# Patient Record
Sex: Male | Born: 1970 | Race: White | Hispanic: No | Marital: Married | Smoking: Current every day smoker
Health system: Southern US, Community
[De-identification: ages and names within clinical notes are randomized; demographics above are authoritative.]

## PROBLEM LIST (undated history)

## (undated) HISTORY — PX: INNER EAR SURGERY: SHX679

## (undated) HISTORY — PX: SHOULDER SURGERY: SHX246

## (undated) HISTORY — PX: CLAVICLE SURGERY: SHX598

---

## 2003-07-06 ENCOUNTER — Other Ambulatory Visit: Payer: Self-pay

## 2003-08-11 ENCOUNTER — Other Ambulatory Visit: Payer: Self-pay

## 2004-06-04 ENCOUNTER — Ambulatory Visit: Payer: Self-pay | Admitting: Physician Assistant

## 2004-06-12 ENCOUNTER — Ambulatory Visit: Payer: Self-pay | Admitting: Pain Medicine

## 2004-06-16 ENCOUNTER — Ambulatory Visit: Payer: Self-pay | Admitting: Pain Medicine

## 2004-07-02 ENCOUNTER — Ambulatory Visit: Payer: Self-pay | Admitting: Physician Assistant

## 2004-07-23 ENCOUNTER — Ambulatory Visit: Payer: Self-pay | Admitting: Pain Medicine

## 2004-08-31 ENCOUNTER — Ambulatory Visit: Payer: Self-pay | Admitting: Physician Assistant

## 2004-09-03 ENCOUNTER — Ambulatory Visit: Payer: Self-pay | Admitting: Pain Medicine

## 2004-09-07 ENCOUNTER — Ambulatory Visit: Payer: Self-pay | Admitting: Pain Medicine

## 2004-09-17 ENCOUNTER — Ambulatory Visit: Payer: Self-pay | Admitting: Physician Assistant

## 2004-11-17 ENCOUNTER — Ambulatory Visit: Payer: Self-pay | Admitting: Anesthesiology

## 2004-12-03 ENCOUNTER — Ambulatory Visit: Payer: Self-pay | Admitting: Anesthesiology

## 2004-12-15 ENCOUNTER — Emergency Department: Payer: Self-pay | Admitting: General Practice

## 2005-01-07 ENCOUNTER — Ambulatory Visit: Payer: Self-pay | Admitting: Internal Medicine

## 2005-02-03 ENCOUNTER — Ambulatory Visit: Payer: Self-pay | Admitting: Anesthesiology

## 2005-03-09 ENCOUNTER — Ambulatory Visit: Payer: Self-pay | Admitting: Anesthesiology

## 2005-03-15 ENCOUNTER — Ambulatory Visit: Payer: Self-pay | Admitting: Internal Medicine

## 2005-03-19 ENCOUNTER — Encounter: Admission: RE | Admit: 2005-03-19 | Discharge: 2005-03-19 | Payer: Self-pay | Admitting: Specialist

## 2005-05-21 ENCOUNTER — Ambulatory Visit: Payer: Self-pay | Admitting: Unknown Physician Specialty

## 2005-05-28 ENCOUNTER — Inpatient Hospital Stay (HOSPITAL_COMMUNITY): Admission: RE | Admit: 2005-05-28 | Discharge: 2005-05-31 | Payer: Self-pay | Admitting: Orthopedic Surgery

## 2005-08-13 ENCOUNTER — Ambulatory Visit: Payer: Self-pay | Admitting: Anesthesiology

## 2005-09-22 ENCOUNTER — Ambulatory Visit: Payer: Self-pay | Admitting: Anesthesiology

## 2005-10-28 ENCOUNTER — Ambulatory Visit: Payer: Self-pay | Admitting: Anesthesiology

## 2005-11-22 ENCOUNTER — Ambulatory Visit: Payer: Self-pay | Admitting: Anesthesiology

## 2006-01-10 ENCOUNTER — Ambulatory Visit: Payer: Self-pay | Admitting: Anesthesiology

## 2006-01-17 ENCOUNTER — Inpatient Hospital Stay: Payer: Self-pay | Admitting: Unknown Physician Specialty

## 2006-01-28 ENCOUNTER — Ambulatory Visit: Payer: Self-pay | Admitting: Internal Medicine

## 2006-02-07 ENCOUNTER — Ambulatory Visit: Payer: Self-pay | Admitting: Anesthesiology

## 2006-03-17 ENCOUNTER — Ambulatory Visit: Payer: Self-pay | Admitting: Anesthesiology

## 2006-04-18 ENCOUNTER — Ambulatory Visit: Payer: Self-pay | Admitting: Anesthesiology

## 2006-05-18 ENCOUNTER — Ambulatory Visit: Payer: Self-pay | Admitting: Anesthesiology

## 2006-06-16 ENCOUNTER — Ambulatory Visit: Payer: Self-pay | Admitting: Anesthesiology

## 2006-07-21 ENCOUNTER — Ambulatory Visit: Payer: Self-pay | Admitting: Anesthesiology

## 2006-09-06 ENCOUNTER — Ambulatory Visit: Payer: Self-pay | Admitting: Anesthesiology

## 2006-10-06 ENCOUNTER — Ambulatory Visit: Payer: Self-pay | Admitting: Anesthesiology

## 2006-10-31 ENCOUNTER — Ambulatory Visit: Payer: Self-pay | Admitting: Anesthesiology

## 2006-12-08 ENCOUNTER — Ambulatory Visit: Payer: Self-pay | Admitting: Anesthesiology

## 2007-01-03 ENCOUNTER — Ambulatory Visit: Payer: Self-pay | Admitting: Anesthesiology

## 2007-01-25 ENCOUNTER — Emergency Department: Payer: Self-pay | Admitting: Unknown Physician Specialty

## 2007-04-26 ENCOUNTER — Ambulatory Visit: Payer: Self-pay | Admitting: Anesthesiology

## 2007-06-09 ENCOUNTER — Ambulatory Visit: Payer: Self-pay | Admitting: Anesthesiology

## 2007-07-06 ENCOUNTER — Ambulatory Visit: Payer: Self-pay | Admitting: Anesthesiology

## 2007-07-15 ENCOUNTER — Ambulatory Visit: Payer: Self-pay | Admitting: Anesthesiology

## 2007-08-07 ENCOUNTER — Ambulatory Visit: Payer: Self-pay | Admitting: Anesthesiology

## 2007-09-14 ENCOUNTER — Ambulatory Visit: Payer: Self-pay | Admitting: Anesthesiology

## 2007-10-18 ENCOUNTER — Ambulatory Visit: Payer: Self-pay | Admitting: Anesthesiology

## 2007-12-13 ENCOUNTER — Ambulatory Visit: Payer: Self-pay | Admitting: Anesthesiology

## 2007-12-27 ENCOUNTER — Ambulatory Visit: Payer: Self-pay | Admitting: Pain Medicine

## 2008-01-02 ENCOUNTER — Ambulatory Visit: Payer: Self-pay | Admitting: Pain Medicine

## 2008-01-03 ENCOUNTER — Ambulatory Visit: Payer: Self-pay | Admitting: Pain Medicine

## 2008-01-11 ENCOUNTER — Ambulatory Visit: Payer: Self-pay | Admitting: Anesthesiology

## 2008-01-31 ENCOUNTER — Ambulatory Visit: Payer: Self-pay | Admitting: Anesthesiology

## 2008-02-27 ENCOUNTER — Ambulatory Visit: Payer: Self-pay | Admitting: Anesthesiology

## 2008-03-25 ENCOUNTER — Ambulatory Visit: Payer: Self-pay | Admitting: Anesthesiology

## 2008-04-25 ENCOUNTER — Ambulatory Visit: Payer: Self-pay | Admitting: Anesthesiology

## 2008-06-10 ENCOUNTER — Ambulatory Visit: Payer: Self-pay | Admitting: Anesthesiology

## 2008-06-14 ENCOUNTER — Emergency Department: Payer: Self-pay | Admitting: Emergency Medicine

## 2008-07-11 ENCOUNTER — Ambulatory Visit: Payer: Self-pay | Admitting: Anesthesiology

## 2008-07-24 ENCOUNTER — Ambulatory Visit: Payer: Self-pay | Admitting: Anesthesiology

## 2008-08-13 ENCOUNTER — Ambulatory Visit: Payer: Self-pay | Admitting: Anesthesiology

## 2008-09-05 ENCOUNTER — Ambulatory Visit: Payer: Self-pay | Admitting: Anesthesiology

## 2009-01-09 ENCOUNTER — Ambulatory Visit: Payer: Self-pay | Admitting: Anesthesiology

## 2009-02-06 ENCOUNTER — Ambulatory Visit: Payer: Self-pay | Admitting: Anesthesiology

## 2009-02-26 ENCOUNTER — Ambulatory Visit: Payer: Self-pay | Admitting: Anesthesiology

## 2009-10-24 ENCOUNTER — Emergency Department: Payer: Self-pay | Admitting: Emergency Medicine

## 2010-06-28 ENCOUNTER — Emergency Department (HOSPITAL_COMMUNITY)
Admission: EM | Admit: 2010-06-28 | Discharge: 2010-06-29 | Payer: Self-pay | Source: Home / Self Care | Admitting: Emergency Medicine

## 2010-06-28 ENCOUNTER — Emergency Department (HOSPITAL_COMMUNITY)
Admission: EM | Admit: 2010-06-28 | Discharge: 2010-06-28 | Payer: Self-pay | Source: Home / Self Care | Admitting: Emergency Medicine

## 2010-11-15 DIAGNOSIS — G894 Chronic pain syndrome: Secondary | ICD-10-CM

## 2010-11-15 DIAGNOSIS — M25529 Pain in unspecified elbow: Secondary | ICD-10-CM

## 2010-11-15 DIAGNOSIS — F17200 Nicotine dependence, unspecified, uncomplicated: Secondary | ICD-10-CM | POA: Insufficient documentation

## 2010-11-15 DIAGNOSIS — M542 Cervicalgia: Secondary | ICD-10-CM

## 2010-11-15 HISTORY — DX: Pain in unspecified elbow: M25.529

## 2010-11-15 HISTORY — DX: Cervicalgia: M54.2

## 2010-11-15 HISTORY — DX: Chronic pain syndrome: G89.4
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
## 2013-02-05 DIAGNOSIS — Z79891 Long term (current) use of opiate analgesic: Secondary | ICD-10-CM

## 2013-02-05 DIAGNOSIS — M533 Sacrococcygeal disorders, not elsewhere classified: Secondary | ICD-10-CM

## 2013-02-05 DIAGNOSIS — Z0289 Encounter for other administrative examinations: Secondary | ICD-10-CM | POA: Insufficient documentation

## 2013-02-05 HISTORY — DX: Long term (current) use of opiate analgesic: Z79.891

## 2013-02-05 HISTORY — DX: Sacrococcygeal disorders, not elsewhere classified: M53.3

## 2013-11-06 DIAGNOSIS — F1190 Opioid use, unspecified, uncomplicated: Secondary | ICD-10-CM | POA: Insufficient documentation

## 2013-11-06 DIAGNOSIS — IMO0002 Reserved for concepts with insufficient information to code with codable children: Secondary | ICD-10-CM

## 2013-11-06 HISTORY — DX: Reserved for concepts with insufficient information to code with codable children: IMO0002

## 2013-11-06 HISTORY — DX: Opioid use, unspecified, uncomplicated: F11.90

## 2014-09-16 DIAGNOSIS — G8929 Other chronic pain: Secondary | ICD-10-CM | POA: Diagnosis not present

## 2014-09-16 DIAGNOSIS — M533 Sacrococcygeal disorders, not elsewhere classified: Secondary | ICD-10-CM | POA: Diagnosis not present

## 2014-09-16 DIAGNOSIS — M7702 Medial epicondylitis, left elbow: Secondary | ICD-10-CM | POA: Diagnosis not present

## 2014-09-16 DIAGNOSIS — M7701 Medial epicondylitis, right elbow: Secondary | ICD-10-CM | POA: Diagnosis not present

## 2014-09-16 DIAGNOSIS — M47816 Spondylosis without myelopathy or radiculopathy, lumbar region: Secondary | ICD-10-CM | POA: Diagnosis not present

## 2014-09-16 DIAGNOSIS — Z791 Long term (current) use of non-steroidal anti-inflammatories (NSAID): Secondary | ICD-10-CM | POA: Diagnosis not present

## 2014-09-16 DIAGNOSIS — Z79891 Long term (current) use of opiate analgesic: Secondary | ICD-10-CM | POA: Diagnosis not present

## 2014-09-16 DIAGNOSIS — F17210 Nicotine dependence, cigarettes, uncomplicated: Secondary | ICD-10-CM | POA: Diagnosis not present

## 2014-09-16 DIAGNOSIS — M542 Cervicalgia: Secondary | ICD-10-CM | POA: Diagnosis not present

## 2014-09-16 DIAGNOSIS — M25529 Pain in unspecified elbow: Secondary | ICD-10-CM | POA: Diagnosis not present

## 2014-11-22 ENCOUNTER — Ambulatory Visit: Payer: Self-pay

## 2014-12-09 DIAGNOSIS — M79629 Pain in unspecified upper arm: Secondary | ICD-10-CM | POA: Diagnosis not present

## 2014-12-09 DIAGNOSIS — M542 Cervicalgia: Secondary | ICD-10-CM | POA: Diagnosis not present

## 2014-12-09 DIAGNOSIS — G8929 Other chronic pain: Secondary | ICD-10-CM | POA: Diagnosis not present

## 2014-12-09 DIAGNOSIS — M1990 Unspecified osteoarthritis, unspecified site: Secondary | ICD-10-CM | POA: Diagnosis not present

## 2014-12-09 DIAGNOSIS — M47816 Spondylosis without myelopathy or radiculopathy, lumbar region: Secondary | ICD-10-CM | POA: Diagnosis not present

## 2014-12-09 DIAGNOSIS — M25529 Pain in unspecified elbow: Secondary | ICD-10-CM | POA: Diagnosis not present

## 2014-12-09 DIAGNOSIS — F1190 Opioid use, unspecified, uncomplicated: Secondary | ICD-10-CM | POA: Diagnosis not present

## 2014-12-09 DIAGNOSIS — M533 Sacrococcygeal disorders, not elsewhere classified: Secondary | ICD-10-CM | POA: Diagnosis not present

## 2014-12-09 DIAGNOSIS — R51 Headache: Secondary | ICD-10-CM | POA: Diagnosis not present

## 2014-12-09 DIAGNOSIS — F17210 Nicotine dependence, cigarettes, uncomplicated: Secondary | ICD-10-CM | POA: Diagnosis not present

## 2015-03-11 DIAGNOSIS — R079 Chest pain, unspecified: Secondary | ICD-10-CM | POA: Diagnosis not present

## 2015-03-24 DIAGNOSIS — M25529 Pain in unspecified elbow: Secondary | ICD-10-CM | POA: Diagnosis not present

## 2015-03-24 DIAGNOSIS — F1190 Opioid use, unspecified, uncomplicated: Secondary | ICD-10-CM | POA: Diagnosis not present

## 2015-03-24 DIAGNOSIS — M542 Cervicalgia: Secondary | ICD-10-CM | POA: Diagnosis not present

## 2015-03-24 DIAGNOSIS — M47816 Spondylosis without myelopathy or radiculopathy, lumbar region: Secondary | ICD-10-CM | POA: Diagnosis not present

## 2015-03-24 DIAGNOSIS — G8929 Other chronic pain: Secondary | ICD-10-CM | POA: Diagnosis not present

## 2015-03-24 DIAGNOSIS — M533 Sacrococcygeal disorders, not elsewhere classified: Secondary | ICD-10-CM | POA: Diagnosis not present

## 2015-03-24 DIAGNOSIS — R51 Headache: Secondary | ICD-10-CM | POA: Diagnosis not present

## 2015-06-19 DIAGNOSIS — M25572 Pain in left ankle and joints of left foot: Secondary | ICD-10-CM | POA: Diagnosis not present

## 2015-06-19 DIAGNOSIS — M25522 Pain in left elbow: Secondary | ICD-10-CM | POA: Diagnosis not present

## 2015-06-19 DIAGNOSIS — M7701 Medial epicondylitis, right elbow: Secondary | ICD-10-CM | POA: Diagnosis not present

## 2015-06-19 DIAGNOSIS — M7702 Medial epicondylitis, left elbow: Secondary | ICD-10-CM | POA: Diagnosis not present

## 2015-06-19 DIAGNOSIS — M533 Sacrococcygeal disorders, not elsewhere classified: Secondary | ICD-10-CM | POA: Diagnosis not present

## 2015-06-19 DIAGNOSIS — M542 Cervicalgia: Secondary | ICD-10-CM | POA: Diagnosis not present

## 2015-06-19 DIAGNOSIS — M25521 Pain in right elbow: Secondary | ICD-10-CM | POA: Diagnosis not present

## 2015-06-19 DIAGNOSIS — G894 Chronic pain syndrome: Secondary | ICD-10-CM | POA: Diagnosis not present

## 2015-06-19 DIAGNOSIS — M47816 Spondylosis without myelopathy or radiculopathy, lumbar region: Secondary | ICD-10-CM | POA: Diagnosis not present

## 2015-07-03 DIAGNOSIS — M25572 Pain in left ankle and joints of left foot: Secondary | ICD-10-CM | POA: Diagnosis not present

## 2015-08-25 ENCOUNTER — Encounter: Payer: Self-pay | Admitting: Emergency Medicine

## 2015-08-25 ENCOUNTER — Emergency Department
Admission: EM | Admit: 2015-08-25 | Discharge: 2015-08-25 | Disposition: A | Discharge status: Home / Self Care | Payer: Commercial Managed Care - HMO | Attending: Emergency Medicine | Admitting: Emergency Medicine

## 2015-08-25 ENCOUNTER — Emergency Department: Payer: Commercial Managed Care - HMO

## 2015-08-25 DIAGNOSIS — R51 Headache: Secondary | ICD-10-CM | POA: Diagnosis not present

## 2015-08-25 DIAGNOSIS — L910 Hypertrophic scar: Secondary | ICD-10-CM | POA: Diagnosis not present

## 2015-08-25 DIAGNOSIS — F17210 Nicotine dependence, cigarettes, uncomplicated: Secondary | ICD-10-CM | POA: Insufficient documentation

## 2015-08-25 DIAGNOSIS — H938X1 Other specified disorders of right ear: Secondary | ICD-10-CM | POA: Diagnosis present

## 2015-08-25 DIAGNOSIS — R42 Dizziness and giddiness: Secondary | ICD-10-CM | POA: Diagnosis not present

## 2015-08-25 MED ORDER — BUTALBITAL-APAP-CAFFEINE 50-325-40 MG PO TABS: 1.0000 | ORAL_TABLET | Freq: Four times a day (QID) | ORAL | Status: DC | PRN

## 2015-08-25 MED ORDER — TRAMADOL HCL 50 MG PO TABS: 50.0000 mg | ORAL_TABLET | Freq: Four times a day (QID) | ORAL | Status: AC | PRN

## 2015-08-25 NOTE — ED Notes (Signed)
AAOx3.  Skin warm and dry.  NAD 
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
## 2015-08-25 NOTE — Discharge Instructions (Signed)
Advised to follow-up with dermatology for definitive vibration and treatment.
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
## 2015-09-10 DIAGNOSIS — F1190 Opioid use, unspecified, uncomplicated: Secondary | ICD-10-CM | POA: Diagnosis not present

## 2015-09-10 DIAGNOSIS — Z79891 Long term (current) use of opiate analgesic: Secondary | ICD-10-CM | POA: Diagnosis not present

## 2015-09-10 DIAGNOSIS — R51 Headache: Secondary | ICD-10-CM | POA: Diagnosis not present

## 2015-09-10 DIAGNOSIS — M47816 Spondylosis without myelopathy or radiculopathy, lumbar region: Secondary | ICD-10-CM | POA: Diagnosis not present

## 2015-09-10 DIAGNOSIS — M25529 Pain in unspecified elbow: Secondary | ICD-10-CM | POA: Diagnosis not present

## 2015-09-10 DIAGNOSIS — Z791 Long term (current) use of non-steroidal anti-inflammatories (NSAID): Secondary | ICD-10-CM | POA: Diagnosis not present

## 2015-09-10 DIAGNOSIS — M542 Cervicalgia: Secondary | ICD-10-CM | POA: Diagnosis not present

## 2015-09-10 DIAGNOSIS — M7702 Medial epicondylitis, left elbow: Secondary | ICD-10-CM | POA: Diagnosis not present

## 2015-09-10 DIAGNOSIS — M533 Sacrococcygeal disorders, not elsewhere classified: Secondary | ICD-10-CM | POA: Diagnosis not present

## 2015-09-10 DIAGNOSIS — M7701 Medial epicondylitis, right elbow: Secondary | ICD-10-CM | POA: Diagnosis not present

## 2015-09-10 DIAGNOSIS — G8929 Other chronic pain: Secondary | ICD-10-CM | POA: Diagnosis not present

## 2015-12-04 ENCOUNTER — Telehealth: Payer: Self-pay | Admitting: Family Medicine

## 2015-12-04 DIAGNOSIS — G894 Chronic pain syndrome: Secondary | ICD-10-CM | POA: Diagnosis not present

## 2015-12-04 DIAGNOSIS — M25529 Pain in unspecified elbow: Secondary | ICD-10-CM | POA: Diagnosis not present

## 2015-12-04 DIAGNOSIS — G8929 Other chronic pain: Secondary | ICD-10-CM | POA: Diagnosis not present

## 2015-12-04 DIAGNOSIS — M533 Sacrococcygeal disorders, not elsewhere classified: Secondary | ICD-10-CM | POA: Diagnosis not present

## 2015-12-04 DIAGNOSIS — Z79891 Long term (current) use of opiate analgesic: Secondary | ICD-10-CM | POA: Diagnosis not present

## 2015-12-04 DIAGNOSIS — M47816 Spondylosis without myelopathy or radiculopathy, lumbar region: Secondary | ICD-10-CM | POA: Diagnosis not present

## 2015-12-04 DIAGNOSIS — M542 Cervicalgia: Secondary | ICD-10-CM | POA: Diagnosis not present

## 2015-12-04 DIAGNOSIS — M25572 Pain in left ankle and joints of left foot: Secondary | ICD-10-CM | POA: Diagnosis not present

## 2015-12-04 NOTE — Telephone Encounter (Signed)
Ok to refer. Thanks

## 2015-12-04 NOTE — Telephone Encounter (Signed)
Patient is a patient of Dr. Sabino Snipes, Jamas Lav from [facility] has called requesting a referral for patient for pain management specialist at [facility]. She states that patient has been following up with Dr. Despina Arias ( her office number is [phone]) and is requesting referral from his primary. Jamas Lav states that patient is in office now and needs approval for referral. Please advise. KW

## 2015-12-04 NOTE — Telephone Encounter (Signed)
Jamas Lav called office back, she can be reached at [phone].KW

## 2015-12-04 NOTE — Telephone Encounter (Signed)
Left message with renee to call back.  Contact number is: [phone] (For [facility] pain management.)  Thanks,   -Mickel Baas

## 2015-12-04 NOTE — Telephone Encounter (Signed)
Order put in please review-aa

## 2015-12-05 NOTE — Telephone Encounter (Signed)
Renee with [facility] called stating they do not need a referral. Pt was seen yesterday and is a pt in their office.  CW:[id number]

## 2015-12-19 DIAGNOSIS — M9511 Cauliflower ear, right ear: Secondary | ICD-10-CM | POA: Diagnosis not present

## 2016-03-02 DIAGNOSIS — G894 Chronic pain syndrome: Secondary | ICD-10-CM | POA: Diagnosis not present

## 2016-03-02 DIAGNOSIS — M25521 Pain in right elbow: Secondary | ICD-10-CM | POA: Diagnosis not present

## 2016-03-02 DIAGNOSIS — G8929 Other chronic pain: Secondary | ICD-10-CM | POA: Diagnosis not present

## 2016-03-02 DIAGNOSIS — M47816 Spondylosis without myelopathy or radiculopathy, lumbar region: Secondary | ICD-10-CM | POA: Diagnosis not present

## 2016-03-02 DIAGNOSIS — M25522 Pain in left elbow: Secondary | ICD-10-CM | POA: Diagnosis not present

## 2016-03-02 DIAGNOSIS — M25529 Pain in unspecified elbow: Secondary | ICD-10-CM | POA: Diagnosis not present

## 2016-03-02 DIAGNOSIS — Z0289 Encounter for other administrative examinations: Secondary | ICD-10-CM | POA: Diagnosis not present

## 2016-03-02 DIAGNOSIS — M25569 Pain in unspecified knee: Secondary | ICD-10-CM | POA: Diagnosis not present

## 2016-03-02 DIAGNOSIS — Z79891 Long term (current) use of opiate analgesic: Secondary | ICD-10-CM | POA: Diagnosis not present

## 2016-03-02 DIAGNOSIS — M7702 Medial epicondylitis, left elbow: Secondary | ICD-10-CM | POA: Diagnosis not present

## 2016-03-02 DIAGNOSIS — M7701 Medial epicondylitis, right elbow: Secondary | ICD-10-CM | POA: Diagnosis not present

## 2016-03-02 DIAGNOSIS — R51 Headache: Secondary | ICD-10-CM | POA: Diagnosis not present

## 2016-03-02 DIAGNOSIS — M533 Sacrococcygeal disorders, not elsewhere classified: Secondary | ICD-10-CM | POA: Diagnosis not present

## 2016-03-02 DIAGNOSIS — M542 Cervicalgia: Secondary | ICD-10-CM | POA: Diagnosis not present

## 2016-04-22 DIAGNOSIS — Z87828 Personal history of other (healed) physical injury and trauma: Secondary | ICD-10-CM | POA: Diagnosis not present

## 2016-04-22 DIAGNOSIS — Z6823 Body mass index (BMI) 23.0-23.9, adult: Secondary | ICD-10-CM | POA: Diagnosis not present

## 2016-04-22 DIAGNOSIS — Z1389 Encounter for screening for other disorder: Secondary | ICD-10-CM | POA: Diagnosis not present

## 2016-04-22 DIAGNOSIS — L905 Scar conditions and fibrosis of skin: Secondary | ICD-10-CM | POA: Diagnosis not present

## 2016-04-22 DIAGNOSIS — H9311 Tinnitus, right ear: Secondary | ICD-10-CM | POA: Diagnosis not present

## 2016-05-20 DIAGNOSIS — H9319 Tinnitus, unspecified ear: Secondary | ICD-10-CM | POA: Diagnosis not present

## 2016-05-20 DIAGNOSIS — M9511 Cauliflower ear, right ear: Secondary | ICD-10-CM | POA: Diagnosis not present

## 2016-05-20 DIAGNOSIS — H906 Mixed conductive and sensorineural hearing loss, bilateral: Secondary | ICD-10-CM | POA: Diagnosis not present

## 2016-05-31 DIAGNOSIS — R51 Headache: Secondary | ICD-10-CM | POA: Diagnosis not present

## 2016-05-31 DIAGNOSIS — M25529 Pain in unspecified elbow: Secondary | ICD-10-CM | POA: Diagnosis not present

## 2016-05-31 DIAGNOSIS — M47816 Spondylosis without myelopathy or radiculopathy, lumbar region: Secondary | ICD-10-CM | POA: Diagnosis not present

## 2016-05-31 DIAGNOSIS — Z79891 Long term (current) use of opiate analgesic: Secondary | ICD-10-CM | POA: Diagnosis not present

## 2016-05-31 DIAGNOSIS — G894 Chronic pain syndrome: Secondary | ICD-10-CM | POA: Diagnosis not present

## 2016-05-31 DIAGNOSIS — G8929 Other chronic pain: Secondary | ICD-10-CM | POA: Diagnosis not present

## 2016-05-31 DIAGNOSIS — M533 Sacrococcygeal disorders, not elsewhere classified: Secondary | ICD-10-CM | POA: Diagnosis not present

## 2016-05-31 DIAGNOSIS — M542 Cervicalgia: Secondary | ICD-10-CM | POA: Diagnosis not present

## 2016-05-31 IMAGING — CT CT HEAD W/O CM
1 series · 16 of 30 positions shown, 20 images · non-contrast
Comparison: 06/29/2010

CLINICAL DATA: Right-sided headache for 3 months with dizziness and
blurred vision.

EXAM:
CT HEAD WITHOUT CONTRAST
TECHNIQUE: Contiguous axial images were obtained from the base of the skull
through the vertex without intravenous contrast.

[Series 2: head wo · axial · 0.40mm/px · z∈[-15,+115]mm · 16 of 30 slices shown, 20 images]
[im 2/30  brain]
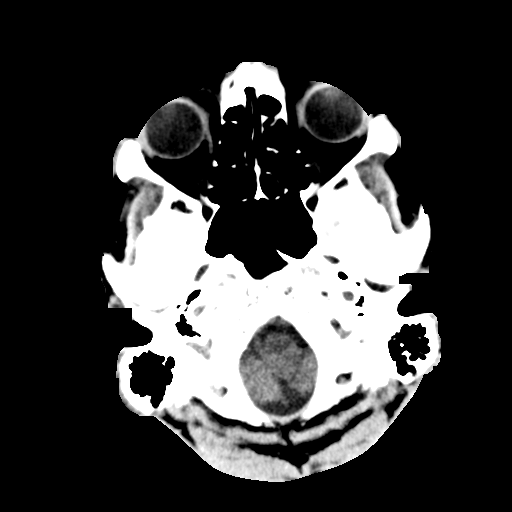
[im 2/30  bone]
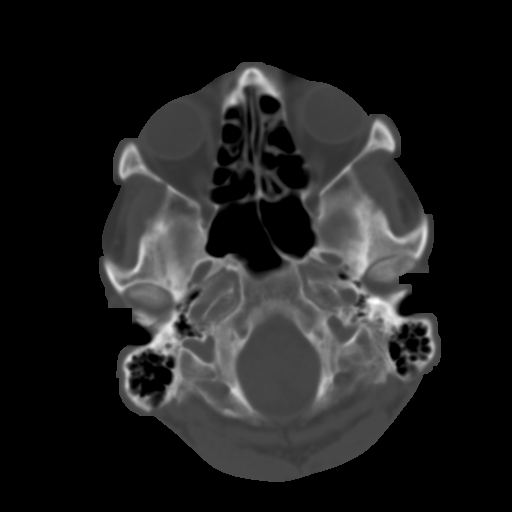
[im 4/30  brain]
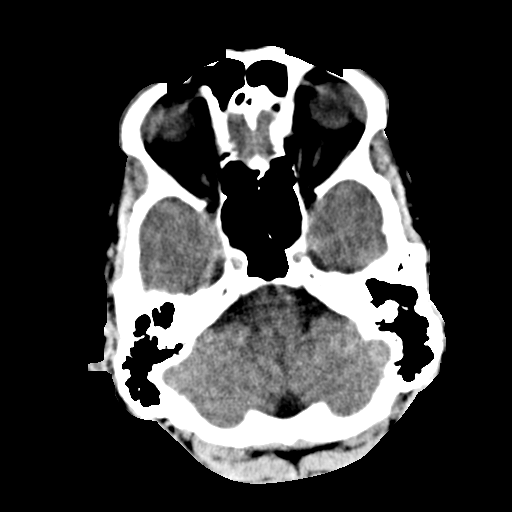
[im 6/30  brain]
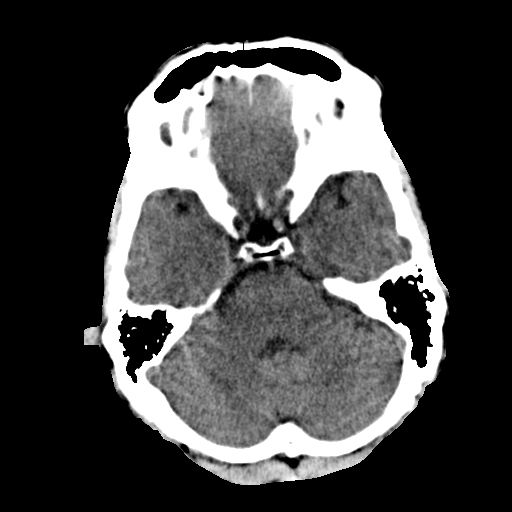
[im 8/30  brain]
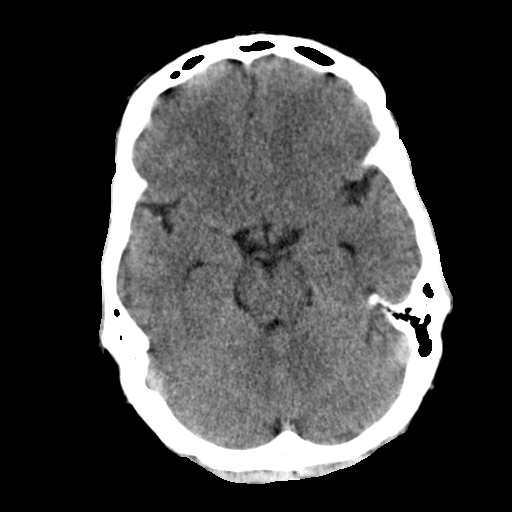
[im 9/30  brain]
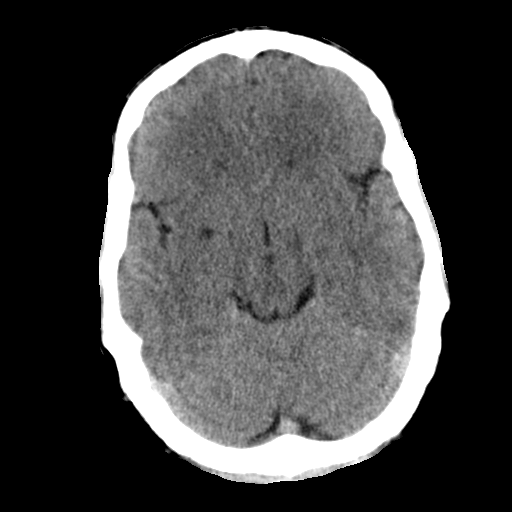
[im 9/30  bone]
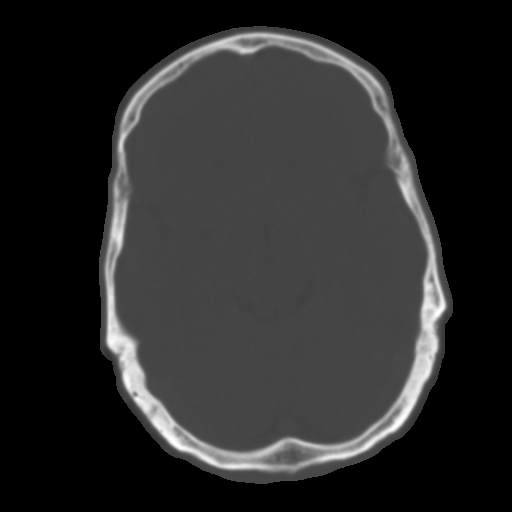
[im 11/30  brain]
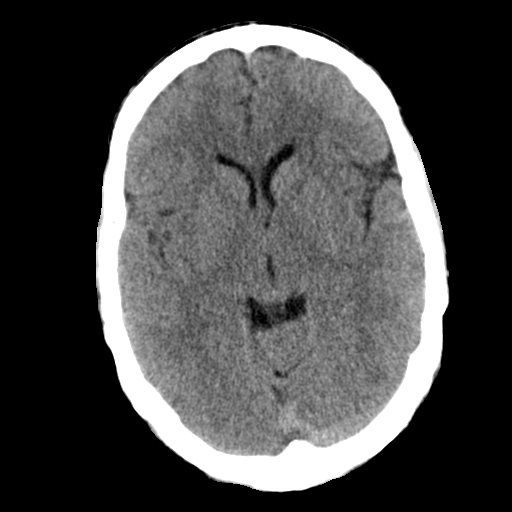
[im 13/30  brain]
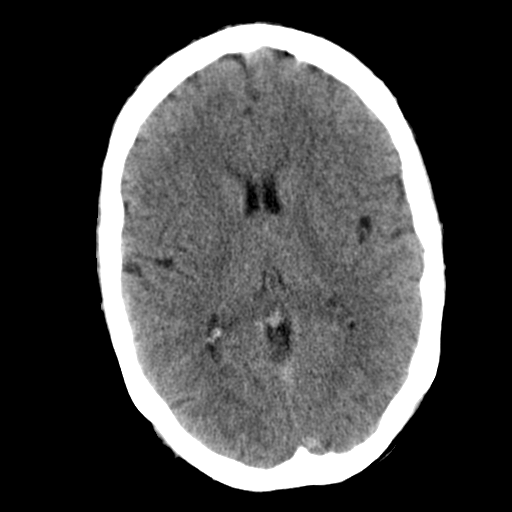
[im 15/30  brain]
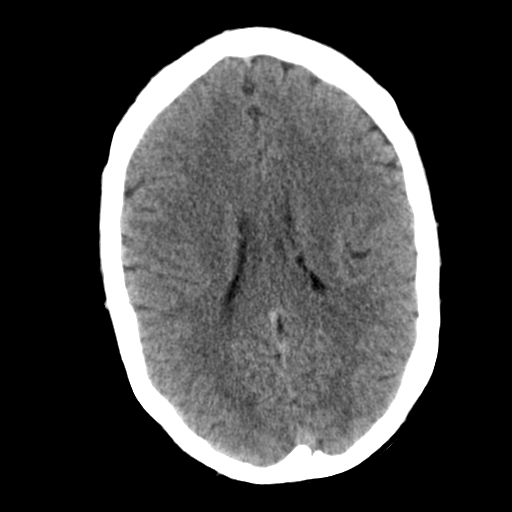
[im 16/30  brain]
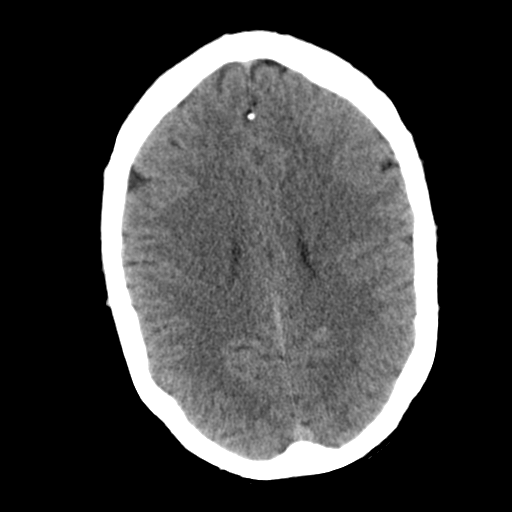
[im 16/30  bone]
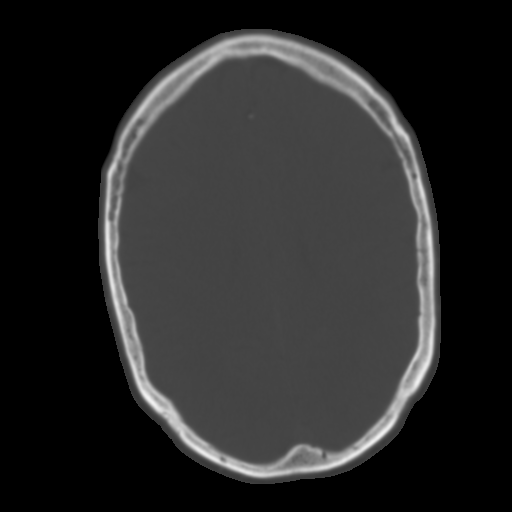
[im 18/30  brain]
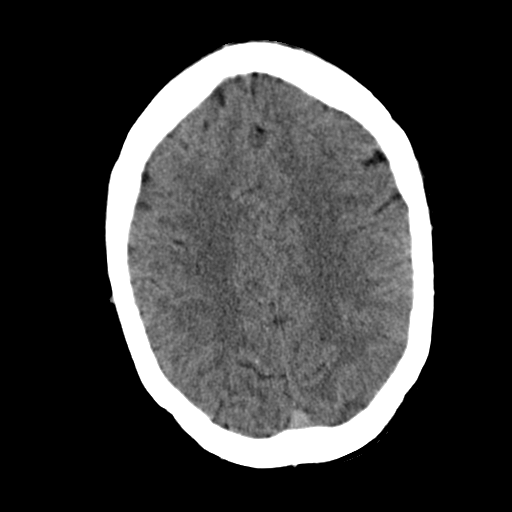
[im 20/30  brain]
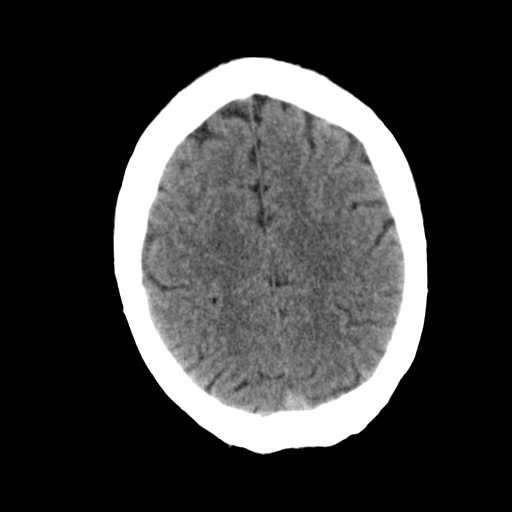
[im 22/30  brain]
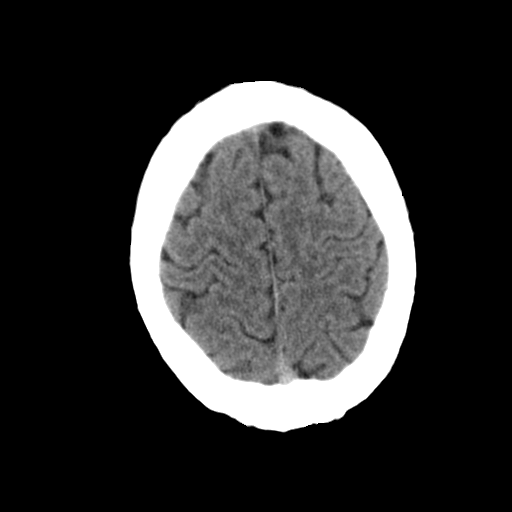
[im 23/30  brain]
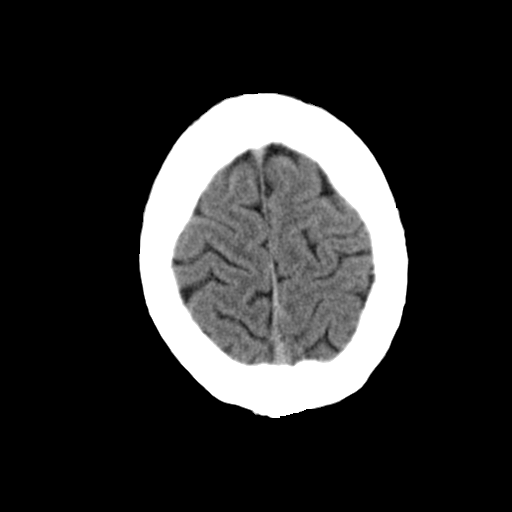
[im 23/30  bone]
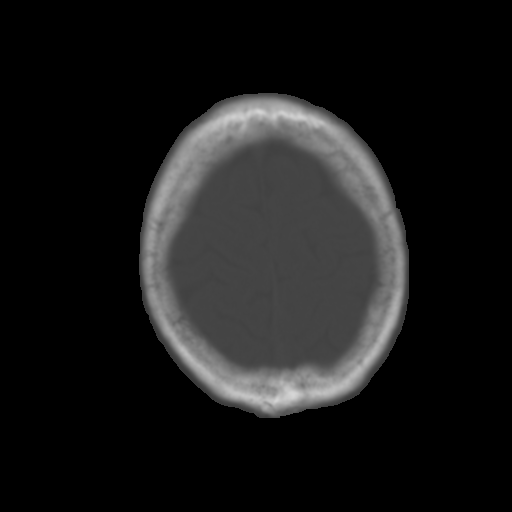
[im 25/30  brain]
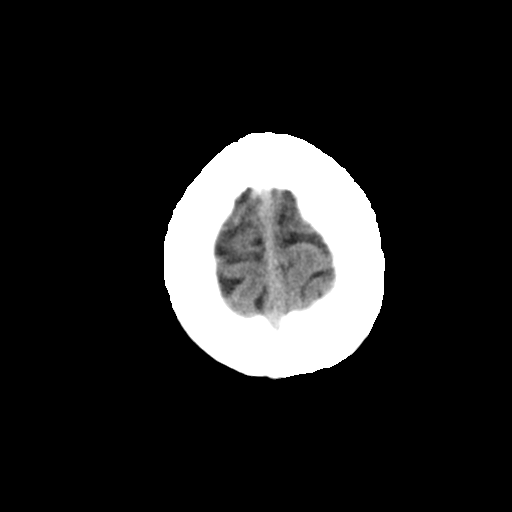
[im 27/30  brain]
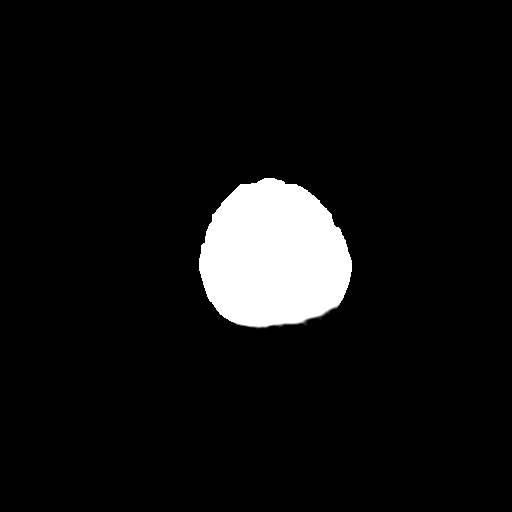
[im 29/30  brain]
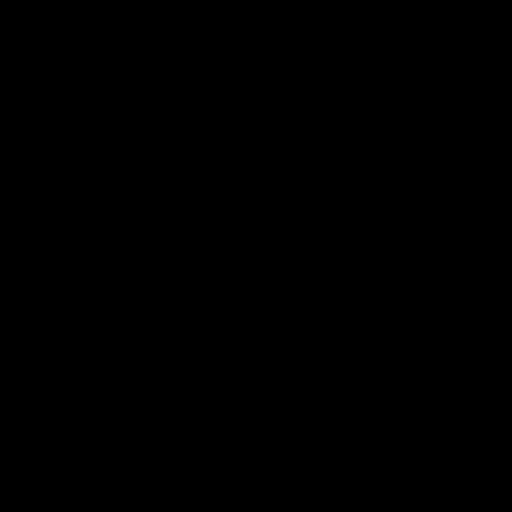

[16 of 30 positions shown; findings below may reference images not displayed]

FINDINGS: Sinuses/Soft tissues: Mild mucosal thickening of ethmoid air cells
and the inferior aspect the left frontal sinus. Clear mastoid air
cells.

Intracranial: No mass lesion, hemorrhage, hydrocephalus, acute
infarct, intra-axial, or extra-axial fluid collection.
IMPRESSION: No acute intracranial abnormality.

Minimal sinus disease.

## 2016-07-06 DIAGNOSIS — M9511 Cauliflower ear, right ear: Secondary | ICD-10-CM | POA: Diagnosis not present

## 2016-07-06 DIAGNOSIS — H61119 Acquired deformity of pinna, unspecified ear: Secondary | ICD-10-CM | POA: Diagnosis not present

## 2016-09-13 DIAGNOSIS — G894 Chronic pain syndrome: Secondary | ICD-10-CM | POA: Diagnosis not present

## 2016-09-13 DIAGNOSIS — M47816 Spondylosis without myelopathy or radiculopathy, lumbar region: Secondary | ICD-10-CM | POA: Diagnosis not present

## 2016-09-13 DIAGNOSIS — M542 Cervicalgia: Secondary | ICD-10-CM | POA: Diagnosis not present

## 2016-09-13 DIAGNOSIS — M533 Sacrococcygeal disorders, not elsewhere classified: Secondary | ICD-10-CM | POA: Diagnosis not present

## 2016-12-15 DIAGNOSIS — M47816 Spondylosis without myelopathy or radiculopathy, lumbar region: Secondary | ICD-10-CM | POA: Diagnosis not present

## 2016-12-15 DIAGNOSIS — M25572 Pain in left ankle and joints of left foot: Secondary | ICD-10-CM | POA: Diagnosis not present

## 2016-12-15 DIAGNOSIS — M7702 Medial epicondylitis, left elbow: Secondary | ICD-10-CM | POA: Diagnosis not present

## 2016-12-15 DIAGNOSIS — M542 Cervicalgia: Secondary | ICD-10-CM | POA: Diagnosis not present

## 2016-12-15 DIAGNOSIS — Z79891 Long term (current) use of opiate analgesic: Secondary | ICD-10-CM | POA: Diagnosis not present

## 2016-12-15 DIAGNOSIS — M533 Sacrococcygeal disorders, not elsewhere classified: Secondary | ICD-10-CM | POA: Diagnosis not present

## 2016-12-15 DIAGNOSIS — M7701 Medial epicondylitis, right elbow: Secondary | ICD-10-CM | POA: Diagnosis not present

## 2016-12-15 DIAGNOSIS — G894 Chronic pain syndrome: Secondary | ICD-10-CM | POA: Diagnosis not present

## 2016-12-15 DIAGNOSIS — M791 Myalgia: Secondary | ICD-10-CM | POA: Diagnosis not present

## 2017-03-17 DIAGNOSIS — M7702 Medial epicondylitis, left elbow: Secondary | ICD-10-CM | POA: Insufficient documentation

## 2017-03-17 DIAGNOSIS — G894 Chronic pain syndrome: Secondary | ICD-10-CM | POA: Diagnosis not present

## 2017-03-17 DIAGNOSIS — M7712 Lateral epicondylitis, left elbow: Secondary | ICD-10-CM | POA: Diagnosis not present

## 2017-03-17 DIAGNOSIS — M7711 Lateral epicondylitis, right elbow: Secondary | ICD-10-CM

## 2017-03-17 DIAGNOSIS — M7701 Medial epicondylitis, right elbow: Secondary | ICD-10-CM

## 2017-03-17 DIAGNOSIS — M791 Myalgia: Secondary | ICD-10-CM | POA: Diagnosis not present

## 2017-03-17 DIAGNOSIS — G8929 Other chronic pain: Secondary | ICD-10-CM | POA: Diagnosis not present

## 2017-03-17 DIAGNOSIS — M533 Sacrococcygeal disorders, not elsewhere classified: Secondary | ICD-10-CM | POA: Diagnosis not present

## 2017-03-17 DIAGNOSIS — M25529 Pain in unspecified elbow: Secondary | ICD-10-CM | POA: Diagnosis not present

## 2017-03-17 HISTORY — DX: Medial epicondylitis, right elbow: M77.01

## 2017-03-17 HISTORY — DX: Lateral epicondylitis, right elbow: M77.11

## 2017-06-14 DIAGNOSIS — M542 Cervicalgia: Secondary | ICD-10-CM | POA: Diagnosis not present

## 2017-06-14 DIAGNOSIS — M47816 Spondylosis without myelopathy or radiculopathy, lumbar region: Secondary | ICD-10-CM | POA: Diagnosis not present

## 2017-06-14 DIAGNOSIS — Z79891 Long term (current) use of opiate analgesic: Secondary | ICD-10-CM | POA: Diagnosis not present

## 2017-06-14 DIAGNOSIS — G894 Chronic pain syndrome: Secondary | ICD-10-CM | POA: Diagnosis not present

## 2017-06-14 DIAGNOSIS — M533 Sacrococcygeal disorders, not elsewhere classified: Secondary | ICD-10-CM | POA: Diagnosis not present

## 2017-09-13 DIAGNOSIS — M47816 Spondylosis without myelopathy or radiculopathy, lumbar region: Secondary | ICD-10-CM | POA: Diagnosis not present

## 2017-09-13 DIAGNOSIS — Z0289 Encounter for other administrative examinations: Secondary | ICD-10-CM | POA: Diagnosis not present

## 2017-09-13 DIAGNOSIS — M533 Sacrococcygeal disorders, not elsewhere classified: Secondary | ICD-10-CM | POA: Diagnosis not present

## 2017-09-13 DIAGNOSIS — G894 Chronic pain syndrome: Secondary | ICD-10-CM | POA: Diagnosis not present

## 2017-09-13 DIAGNOSIS — Z79891 Long term (current) use of opiate analgesic: Secondary | ICD-10-CM | POA: Diagnosis not present

## 2017-12-01 DIAGNOSIS — G894 Chronic pain syndrome: Secondary | ICD-10-CM | POA: Diagnosis not present

## 2017-12-01 DIAGNOSIS — M533 Sacrococcygeal disorders, not elsewhere classified: Secondary | ICD-10-CM | POA: Diagnosis not present

## 2017-12-01 DIAGNOSIS — M25529 Pain in unspecified elbow: Secondary | ICD-10-CM | POA: Diagnosis not present

## 2017-12-01 DIAGNOSIS — M47816 Spondylosis without myelopathy or radiculopathy, lumbar region: Secondary | ICD-10-CM | POA: Diagnosis not present

## 2017-12-01 DIAGNOSIS — M7701 Medial epicondylitis, right elbow: Secondary | ICD-10-CM | POA: Diagnosis not present

## 2017-12-01 DIAGNOSIS — Z79891 Long term (current) use of opiate analgesic: Secondary | ICD-10-CM | POA: Diagnosis not present

## 2017-12-01 DIAGNOSIS — M1710 Unilateral primary osteoarthritis, unspecified knee: Secondary | ICD-10-CM | POA: Diagnosis not present

## 2017-12-01 DIAGNOSIS — M7702 Medial epicondylitis, left elbow: Secondary | ICD-10-CM | POA: Diagnosis not present

## 2017-12-01 DIAGNOSIS — G8929 Other chronic pain: Secondary | ICD-10-CM | POA: Diagnosis not present

## 2017-12-01 DIAGNOSIS — M542 Cervicalgia: Secondary | ICD-10-CM | POA: Diagnosis not present

## 2018-03-06 DIAGNOSIS — M542 Cervicalgia: Secondary | ICD-10-CM | POA: Diagnosis not present

## 2018-03-06 DIAGNOSIS — M533 Sacrococcygeal disorders, not elsewhere classified: Secondary | ICD-10-CM | POA: Diagnosis not present

## 2018-03-06 DIAGNOSIS — M7701 Medial epicondylitis, right elbow: Secondary | ICD-10-CM | POA: Diagnosis not present

## 2018-03-06 DIAGNOSIS — M7711 Lateral epicondylitis, right elbow: Secondary | ICD-10-CM | POA: Diagnosis not present

## 2018-03-06 DIAGNOSIS — R51 Headache: Secondary | ICD-10-CM | POA: Diagnosis not present

## 2018-03-06 DIAGNOSIS — Z79891 Long term (current) use of opiate analgesic: Secondary | ICD-10-CM | POA: Diagnosis not present

## 2018-03-06 DIAGNOSIS — G8929 Other chronic pain: Secondary | ICD-10-CM | POA: Diagnosis not present

## 2018-03-06 DIAGNOSIS — M7712 Lateral epicondylitis, left elbow: Secondary | ICD-10-CM | POA: Diagnosis not present

## 2018-03-06 DIAGNOSIS — Z0289 Encounter for other administrative examinations: Secondary | ICD-10-CM | POA: Diagnosis not present

## 2018-03-06 DIAGNOSIS — M25529 Pain in unspecified elbow: Secondary | ICD-10-CM | POA: Diagnosis not present

## 2018-03-06 DIAGNOSIS — M47816 Spondylosis without myelopathy or radiculopathy, lumbar region: Secondary | ICD-10-CM | POA: Diagnosis not present

## 2018-03-06 DIAGNOSIS — G894 Chronic pain syndrome: Secondary | ICD-10-CM | POA: Diagnosis not present

## 2018-04-10 DIAGNOSIS — M25529 Pain in unspecified elbow: Secondary | ICD-10-CM | POA: Diagnosis not present

## 2018-04-10 DIAGNOSIS — M7711 Lateral epicondylitis, right elbow: Secondary | ICD-10-CM | POA: Diagnosis not present

## 2018-04-10 DIAGNOSIS — M7702 Medial epicondylitis, left elbow: Secondary | ICD-10-CM | POA: Diagnosis not present

## 2018-04-10 DIAGNOSIS — M7701 Medial epicondylitis, right elbow: Secondary | ICD-10-CM | POA: Diagnosis not present

## 2018-04-10 DIAGNOSIS — G8929 Other chronic pain: Secondary | ICD-10-CM | POA: Diagnosis not present

## 2018-04-10 DIAGNOSIS — M7712 Lateral epicondylitis, left elbow: Secondary | ICD-10-CM | POA: Diagnosis not present

## 2018-04-10 DIAGNOSIS — M47816 Spondylosis without myelopathy or radiculopathy, lumbar region: Secondary | ICD-10-CM | POA: Diagnosis not present

## 2018-06-01 DIAGNOSIS — G8929 Other chronic pain: Secondary | ICD-10-CM | POA: Diagnosis not present

## 2018-06-01 DIAGNOSIS — G894 Chronic pain syndrome: Secondary | ICD-10-CM | POA: Diagnosis not present

## 2018-06-01 DIAGNOSIS — Z79891 Long term (current) use of opiate analgesic: Secondary | ICD-10-CM | POA: Diagnosis not present

## 2018-06-01 DIAGNOSIS — M47816 Spondylosis without myelopathy or radiculopathy, lumbar region: Secondary | ICD-10-CM | POA: Diagnosis not present

## 2018-06-01 DIAGNOSIS — M533 Sacrococcygeal disorders, not elsewhere classified: Secondary | ICD-10-CM | POA: Diagnosis not present

## 2018-06-01 DIAGNOSIS — M25529 Pain in unspecified elbow: Secondary | ICD-10-CM | POA: Diagnosis not present

## 2018-06-13 DIAGNOSIS — F330 Major depressive disorder, recurrent, mild: Secondary | ICD-10-CM | POA: Diagnosis not present

## 2018-06-13 DIAGNOSIS — F410 Panic disorder [episodic paroxysmal anxiety] without agoraphobia: Secondary | ICD-10-CM | POA: Diagnosis not present

## 2018-09-01 DIAGNOSIS — M7918 Myalgia, other site: Secondary | ICD-10-CM | POA: Diagnosis not present

## 2018-09-01 DIAGNOSIS — M47816 Spondylosis without myelopathy or radiculopathy, lumbar region: Secondary | ICD-10-CM | POA: Diagnosis not present

## 2018-09-01 DIAGNOSIS — M25529 Pain in unspecified elbow: Secondary | ICD-10-CM | POA: Diagnosis not present

## 2018-09-01 DIAGNOSIS — G894 Chronic pain syndrome: Secondary | ICD-10-CM | POA: Diagnosis not present

## 2018-09-01 DIAGNOSIS — M533 Sacrococcygeal disorders, not elsewhere classified: Secondary | ICD-10-CM | POA: Diagnosis not present

## 2018-09-01 DIAGNOSIS — Z0289 Encounter for other administrative examinations: Secondary | ICD-10-CM | POA: Diagnosis not present

## 2018-09-01 DIAGNOSIS — Z79891 Long term (current) use of opiate analgesic: Secondary | ICD-10-CM | POA: Diagnosis not present

## 2018-10-03 DIAGNOSIS — M25529 Pain in unspecified elbow: Secondary | ICD-10-CM | POA: Diagnosis not present

## 2018-10-03 DIAGNOSIS — M7712 Lateral epicondylitis, left elbow: Secondary | ICD-10-CM | POA: Diagnosis not present

## 2018-10-03 DIAGNOSIS — M7701 Medial epicondylitis, right elbow: Secondary | ICD-10-CM | POA: Diagnosis not present

## 2018-10-03 DIAGNOSIS — M25521 Pain in right elbow: Secondary | ICD-10-CM | POA: Diagnosis not present

## 2018-10-03 DIAGNOSIS — M25522 Pain in left elbow: Secondary | ICD-10-CM | POA: Diagnosis not present

## 2018-10-03 DIAGNOSIS — M7702 Medial epicondylitis, left elbow: Secondary | ICD-10-CM | POA: Diagnosis not present

## 2018-10-03 DIAGNOSIS — M7711 Lateral epicondylitis, right elbow: Secondary | ICD-10-CM | POA: Diagnosis not present

## 2018-10-12 ENCOUNTER — Ambulatory Visit: Payer: Self-pay | Admitting: Urology

## 2018-10-12 NOTE — Progress Notes (Incomplete)
° °  2/20//2020 7:52 AM   Paul Salinas 11/01/1970 [id number]  Referring provider: No referring provider defined for this encounter.  No chief complaint on file.   HPI: Paul Salinas is a 48 yo M who presents today for the evaluation and management of urinary frequency.     PMH: No past medical history on file.  Surgical History: *** The histories are not reviewed yet. Please review them in the "History" navigator section and refresh this [place].  Home Medications:  Allergies as of 10/12/2018   No Known Allergies     Medication List       Accurate as of October 12, 2018  7:52 AM. Always use your most recent med list.        butalbital-acetaminophen-caffeine 50-325-40 MG tablet Commonly known as:  FIORICET Take 1-2 tablets by mouth every 6 (six) hours as needed for headache.       Allergies: Allergies no known allergies  Family History: No family history on file.  Social History:  reports that he has been smoking cigarettes. He has been smoking about 0.50 packs per day. He has never used smokeless tobacco. He reports that he does not drink alcohol or use drugs.  ROS:                                        Physical Exam: There were no vitals taken for this visit.  Constitutional:  Alert and oriented, No acute distress. HEENT: [place] AT, moist mucus membranes.  Trachea midline, no masses. Cardiovascular: No clubbing, cyanosis, or edema. Respiratory: Normal respiratory effort, no increased work of breathing. GI: Abdomen is soft, nontender, nondistended, no abdominal masses GU: No CVA tenderness Lymph: No cervical or inguinal lymphadenopathy. Skin: No rashes, bruises or suspicious lesions. Neurologic: Grossly intact, no focal deficits, moving all 4 extremities. Psychiatric: Normal mood and affect.  Laboratory Data: No results found for: WBC, HGB, HCT, MCV, PLT  No results found for: CREATININE  No results found for: PSA  No  results found for: TESTOSTERONE  No results found for: HGBA1C  Urinalysis No results found for: COLORURINE, APPEARANCEUR, LABSPEC, PHURINE, GLUCOSEU, HGBUR, BILIRUBINUR, KETONESUR, PROTEINUR, UROBILINOGEN, NITRITE, LEUKOCYTESUR  No results found for: LABMICR, [place], RBCUA, LABEPIT, MUCUS, BACTERIA  Pertinent Imaging: *** No results found for this or any previous visit. No results found for this or any previous visit. No results found for this or any previous visit. No results found for this or any previous visit. No results found for this or any previous visit. No results found for this or any previous visit. No results found for this or any previous visit. No results found for this or any previous visit.  Assessment & Plan:    @DIAGMED @  No follow-ups on file.  [facility] [address] [place], [place] [address] [phone]  I, Paul Salinas, am acting as a scribe for Dr. Nicki Reaper C. Stoioff,  {Add Media planner

## 2018-11-15 ENCOUNTER — Ambulatory Visit: Payer: Self-pay | Admitting: Urology

## 2018-12-14 ENCOUNTER — Ambulatory Visit: Payer: Self-pay | Admitting: Urology

## 2019-01-09 ENCOUNTER — Ambulatory Visit: Payer: Medicare HMO | Admitting: Urology

## 2019-01-09 ENCOUNTER — Encounter: Payer: Self-pay | Admitting: Urology

## 2019-01-09 ENCOUNTER — Other Ambulatory Visit: Payer: Self-pay

## 2019-01-09 ENCOUNTER — Other Ambulatory Visit: Payer: Self-pay | Admitting: Urology

## 2019-01-09 VITALS — BP 128/77 | HR 63 | Ht 67.0 in | Wt 161.0 lb

## 2019-01-09 DIAGNOSIS — R350 Frequency of micturition: Secondary | ICD-10-CM

## 2019-01-09 DIAGNOSIS — R109 Unspecified abdominal pain: Secondary | ICD-10-CM

## 2019-01-09 LAB — URINALYSIS, COMPLETE
Bilirubin, UA: NEGATIVE
Glucose, UA: NEGATIVE
Ketones, UA: NEGATIVE
Leukocytes,UA: NEGATIVE
Nitrite, UA: NEGATIVE
Protein,UA: NEGATIVE
RBC, UA: NEGATIVE
Specific Gravity, UA: 1.025 (ref 1.005–1.030)
Urobilinogen, Ur: 2 mg/dL — ABNORMAL HIGH (ref 0.2–1.0)
pH, UA: 6 (ref 5.0–7.5)

## 2019-01-09 LAB — BLADDER SCAN AMB NON-IMAGING

## 2019-01-09 LAB — MICROSCOPIC EXAMINATION
Bacteria, UA: NONE SEEN
Epithelial Cells (non renal): NONE SEEN /hpf (ref 0–10)
RBC, Urine: NONE SEEN /hpf (ref 0–2)

## 2019-01-09 MED ORDER — TAMSULOSIN HCL 0.4 MG PO CAPS: 0.4000 mg | ORAL_CAPSULE | Freq: Every day | ORAL | 1 refills | Status: DC

## 2019-01-09 NOTE — Progress Notes (Signed)
01/09/2019 3:42 PM   Paul Salinas [birth year] [id number]  Referring provider: No referring provider defined for this encounter.  Chief Complaint  Patient presents with  . Other    HPI: 48 year old male presents for evaluation of lower urinary tract symptoms.  He complains of a year of progressively worsening voiding symptoms.  Over the past 4 to 6 weeks he complains of sensation of incomplete emptying, frequency, intermittent urinary stream, urgency, weak urinary stream, straining to urinate and nocturia x5.  IPSS completed today was 32/6. Denies dysuria, gross hematuria or flank/abdominal/pelvic/scrotal pain.  Approximately 1-1/2 weeks ago he had severe bilateral flank pain that resolved after approximately 12 hours.  It was not related to voiding and he did not have any evidence of urinary retention.  Past urologic history remarkable for erectile dysfunction treated with sildenafil.    PMH: Past Medical History:  Diagnosis Date  . Chronic pain associated with significant psychosocial dysfunction 11/15/2010  . Chronic, continuous use of opioids 11/06/2013  . Epicondylitis 11/06/2013  . Lateral epicondylitis of both elbows 03/17/2017  . Long term current use of opiate analgesic 02/05/2013  . Medial epicondylitis of both elbows 03/17/2017  . Neck pain 11/15/2010  . Pain in joint, upper arm 11/15/2010  . Sacroiliac joint pain 02/05/2013    Surgical History: Past Surgical History:  Procedure Laterality Date  . SHOULDER SURGERY Right    and clavical    Home Medications:  Allergies as of 01/09/2019   No Known Allergies     Medication List       Accurate as of Jan 09, 2019  3:42 PM. If you have any questions, ask your nurse or doctor.        amitriptyline 25 MG tablet Commonly known as:  ELAVIL   butalbital-acetaminophen-caffeine 50-325-40 MG tablet Commonly known as:  Fioricet Take 1-2 tablets by mouth every 6 (six) hours as needed for headache.   morphine 30 MG 12  hr tablet Commonly known as:  MS CONTIN TAKE 1 TABLET (30 MG TOTAL) BY MOUTH EVERY EIGHT (8) HOURS. DO NOT FILL UNTIL 12/10/18   sildenafil 20 MG tablet Commonly known as:  REVATIO TAKE 3 TABLETS (60 MG) ORALLY DAILY AS NEEDED 1 HOUR BEFORE SEX (MAX 3 TABLETS/DAY)       Allergies: No Known Allergies  Family History: No family history on file.  Social History:  reports that he has been smoking cigarettes. He has been smoking about 0.50 packs per day. He has never used smokeless tobacco. He reports that he does not drink alcohol or use drugs.  ROS: UROLOGY Frequent Urination?: No Hard to postpone urination?: No Burning/pain with urination?: No Get up at night to urinate?: No Leakage of urine?: No Urine stream starts and stops?: No Trouble starting stream?: No Do you have to strain to urinate?: No Blood in urine?: No Urinary tract infection?: No Sexually transmitted disease?: No Injury to kidneys or bladder?: No Painful intercourse?: No Weak stream?: No Erection problems?: No Penile pain?: No  Gastrointestinal Nausea?: No Vomiting?: No Indigestion/heartburn?: No Diarrhea?: No Constipation?: No  Constitutional Fever: No Night sweats?: No Weight loss?: No Fatigue?: No  Skin Skin rash/lesions?: No Itching?: No  Eyes Blurred vision?: No Double vision?: No  Ears/Nose/Throat Sore throat?: No Sinus problems?: No  Hematologic/Lymphatic Swollen glands?: No Easy bruising?: No  Cardiovascular Leg swelling?: No Chest pain?: No  Respiratory Cough?: No Shortness of breath?: No  Endocrine Excessive thirst?: No  Musculoskeletal Back pain?: No Joint pain?: No  Neurological Headaches?: No Dizziness?: No  Psychologic Depression?: No Anxiety?: No  Physical Exam: BP 128/77   Pulse 63   Ht 5\' 7"  (1.702 m)   Wt 161 lb (73 kg)   BMI 25.22 kg/m   Constitutional:  Alert and oriented, No acute distress. HEENT: [place] AT, moist mucus membranes.  Trachea  midline, no masses. Cardiovascular: No clubbing, cyanosis, or edema. Respiratory: Normal respiratory effort, no increased work of breathing. GI: Abdomen is soft, nontender, nondistended, no abdominal masses GU: No CVA tenderness.  Prostate 35 g, flat, smooth without nodules or induration. Lymph: No cervical or inguinal lymphadenopathy. Skin: No rashes, bruises or suspicious lesions. Neurologic: Grossly intact, no focal deficits, moving all 4 extremities. Psychiatric: Normal mood and affect.  Laboratory Data:  Urinalysis Dipstick/microscopy negative  Assessment & Plan:   48 year old male with severe lower urinary tract symptoms.  PVR by bladder scan was 0 mL.  I discussed potential causes of his voiding symptoms with the most common being prostatic outlet obstruction and urethral stricture less common.  Will initially treat with an alpha-blocker and Rx tamsulosin was sent to his pharmacy.  Follow-up in approximately 4 weeks and if his symptoms have not significantly improved we will perform cystoscopy at that visit.  Renal ultrasound ordered for upper tract screening and history of bilateral flank pain.   Abbie Sons, [facility] [address], [place], [place] [address] [phone]

## 2019-01-11 ENCOUNTER — Ambulatory Visit: Payer: Self-pay | Admitting: Urology

## 2019-01-23 ENCOUNTER — Other Ambulatory Visit: Payer: Self-pay | Admitting: Urology

## 2019-01-23 MED ORDER — TAMSULOSIN HCL 0.4 MG PO CAPS: 0.4000 mg | ORAL_CAPSULE | Freq: Every day | ORAL | 1 refills | Status: DC

## 2019-02-06 ENCOUNTER — Other Ambulatory Visit: Payer: Medicare HMO | Admitting: Urology

## 2019-02-07 ENCOUNTER — Other Ambulatory Visit: Payer: Self-pay

## 2019-02-07 ENCOUNTER — Ambulatory Visit
Admission: RE | Admit: 2019-02-07 | Discharge: 2019-02-07 | Disposition: A | Discharge status: Home / Self Care | Payer: Medicare HMO | Source: Ambulatory Visit | Attending: Urology | Admitting: Urology

## 2019-02-07 DIAGNOSIS — R109 Unspecified abdominal pain: Secondary | ICD-10-CM | POA: Diagnosis not present

## 2019-02-07 DIAGNOSIS — N3289 Other specified disorders of bladder: Secondary | ICD-10-CM | POA: Diagnosis not present

## 2019-02-15 ENCOUNTER — Other Ambulatory Visit: Payer: Self-pay

## 2019-02-15 ENCOUNTER — Encounter: Payer: Self-pay | Admitting: Urology

## 2019-02-15 ENCOUNTER — Ambulatory Visit (INDEPENDENT_AMBULATORY_CARE_PROVIDER_SITE_OTHER): Payer: Medicare HMO | Admitting: Urology

## 2019-02-15 DIAGNOSIS — R350 Frequency of micturition: Secondary | ICD-10-CM | POA: Diagnosis not present

## 2019-02-15 LAB — URINALYSIS, COMPLETE
Bilirubin, UA: NEGATIVE
Glucose, UA: NEGATIVE
Ketones, UA: NEGATIVE
Leukocytes,UA: NEGATIVE
Nitrite, UA: NEGATIVE
Protein,UA: NEGATIVE
Specific Gravity, UA: 1.025 (ref 1.005–1.030)
Urobilinogen, Ur: 1 mg/dL (ref 0.2–1.0)
pH, UA: 5.5 (ref 5.0–7.5)

## 2019-02-15 LAB — MICROSCOPIC EXAMINATION
Bacteria, UA: NONE SEEN
Epithelial Cells (non renal): NONE SEEN /hpf (ref 0–10)

## 2019-02-15 MED ORDER — TAMSULOSIN HCL 0.4 MG PO CAPS: 0.8000 mg | ORAL_CAPSULE | Freq: Every day | ORAL | 1 refills | Status: DC

## 2019-02-15 NOTE — Progress Notes (Signed)
   02/15/19  CC:  Chief Complaint  Patient presents with  . Urinary Frequency    possible cysto    HPI: Refer to my previous note of 01/09/2019.  He had severe obstructive and storage related voiding symptoms and was started on tamsulosin.  He does note improvement in his urinary stream, hesitancy but still complains of frequency and urgency.  Denies dysuria or gross hematuria.  Renal ultrasound showed no upper tract abnormalities and estimated postvoid residual was 10 mL.  There were no vitals taken for this visit. NED. A&Ox3.   No respiratory distress   Abd soft, NT, ND Normal phallus with bilateral descended testicles  Cystoscopy Procedure Note  Patient identification was confirmed, informed consent was obtained, and patient was prepped using Betadine solution.  Lidocaine jelly was administered per urethral meatus.     Pre-Procedure: - Inspection reveals a normal caliber ureteral meatus.  Procedure: The flexible cystoscope was introduced without difficulty - No urethral strictures/lesions are present. - Lateral lobe enlargement prostate; >4 cm prostatic urethra - Moderate elevation bladder neck - Bilateral ureteral orifices identified - Bladder mucosa  reveals no ulcers, tumors, or lesions - No bladder stones -Moderate trabeculation  Retroflexion shows no intravesical median lobe   Post-Procedure: - Patient tolerated the procedure well  Assessment/ Plan: Cystoscopy remarkable for prominent prostatic enlargement.  Will increase his tamsulosin to 0.8 mg.  Follow-up 3 months for IPSS and symptom reassessment.  Return in about 3 months (around 05/18/2019) for Recheck.  Abbie Sons, MD

## 2019-02-28 DIAGNOSIS — Z720 Tobacco use: Secondary | ICD-10-CM | POA: Diagnosis not present

## 2019-02-28 DIAGNOSIS — F331 Major depressive disorder, recurrent, moderate: Secondary | ICD-10-CM | POA: Diagnosis not present

## 2019-02-28 DIAGNOSIS — F410 Panic disorder [episodic paroxysmal anxiety] without agoraphobia: Secondary | ICD-10-CM | POA: Diagnosis not present

## 2019-03-23 DIAGNOSIS — F17200 Nicotine dependence, unspecified, uncomplicated: Secondary | ICD-10-CM | POA: Diagnosis not present

## 2019-03-23 DIAGNOSIS — M7701 Medial epicondylitis, right elbow: Secondary | ICD-10-CM | POA: Diagnosis not present

## 2019-03-23 DIAGNOSIS — M7712 Lateral epicondylitis, left elbow: Secondary | ICD-10-CM | POA: Diagnosis not present

## 2019-03-23 DIAGNOSIS — G894 Chronic pain syndrome: Secondary | ICD-10-CM | POA: Diagnosis not present

## 2019-03-23 DIAGNOSIS — M7702 Medial epicondylitis, left elbow: Secondary | ICD-10-CM | POA: Diagnosis not present

## 2019-03-23 DIAGNOSIS — M25529 Pain in unspecified elbow: Secondary | ICD-10-CM | POA: Diagnosis not present

## 2019-03-23 DIAGNOSIS — M7711 Lateral epicondylitis, right elbow: Secondary | ICD-10-CM | POA: Diagnosis not present

## 2019-05-18 ENCOUNTER — Ambulatory Visit: Payer: Medicare HMO | Admitting: Urology

## 2019-05-31 ENCOUNTER — Ambulatory Visit: Payer: Medicare HMO | Admitting: Urology

## 2019-06-05 DIAGNOSIS — M7701 Medial epicondylitis, right elbow: Secondary | ICD-10-CM | POA: Diagnosis not present

## 2019-06-05 DIAGNOSIS — M7711 Lateral epicondylitis, right elbow: Secondary | ICD-10-CM | POA: Diagnosis not present

## 2019-06-05 DIAGNOSIS — M7702 Medial epicondylitis, left elbow: Secondary | ICD-10-CM | POA: Diagnosis not present

## 2019-06-05 DIAGNOSIS — M542 Cervicalgia: Secondary | ICD-10-CM | POA: Diagnosis not present

## 2019-06-05 DIAGNOSIS — Z79891 Long term (current) use of opiate analgesic: Secondary | ICD-10-CM | POA: Diagnosis not present

## 2019-06-05 DIAGNOSIS — M47816 Spondylosis without myelopathy or radiculopathy, lumbar region: Secondary | ICD-10-CM | POA: Diagnosis not present

## 2019-06-05 DIAGNOSIS — G894 Chronic pain syndrome: Secondary | ICD-10-CM | POA: Diagnosis not present

## 2019-06-05 DIAGNOSIS — M533 Sacrococcygeal disorders, not elsewhere classified: Secondary | ICD-10-CM | POA: Diagnosis not present

## 2019-06-05 DIAGNOSIS — M7712 Lateral epicondylitis, left elbow: Secondary | ICD-10-CM | POA: Diagnosis not present

## 2019-06-13 ENCOUNTER — Encounter: Payer: Self-pay | Admitting: Urology

## 2019-06-13 ENCOUNTER — Other Ambulatory Visit: Payer: Self-pay

## 2019-06-13 ENCOUNTER — Ambulatory Visit (INDEPENDENT_AMBULATORY_CARE_PROVIDER_SITE_OTHER): Payer: Medicare HMO | Admitting: Urology

## 2019-06-13 VITALS — BP 133/83 | HR 98 | Ht 67.0 in | Wt 161.0 lb

## 2019-06-13 DIAGNOSIS — N401 Enlarged prostate with lower urinary tract symptoms: Secondary | ICD-10-CM | POA: Diagnosis not present

## 2019-06-13 DIAGNOSIS — R350 Frequency of micturition: Secondary | ICD-10-CM

## 2019-06-13 LAB — BLADDER SCAN AMB NON-IMAGING

## 2019-06-13 NOTE — Progress Notes (Signed)
06/13/2019 2:26 PM   Paul Salinas 14-Jul-1971 [id number]  Referring provider: No referring provider defined for this encounter.  Chief Complaint  Patient presents with  . Urinary Frequency    HPI: 48 y.o. male seen May 2020 for severe lower urinary tract symptoms.  He was started on alpha-blocker and underwent cystoscopy June 2020 which was remarkable for lateral lobe enlargement and moderate bladder neck elevation.  His tamsulosin was increased to 0.8 mg and he presents for follow-up.  He states his urinary stream has improved though he still has storage related voiding symptoms frequency, urgency, rare urge incontinence and nocturia x5.  Renal ultrasound showed no upper tract abnormalities.   PMH: Past Medical History:  Diagnosis Date  . Chronic pain associated with significant psychosocial dysfunction 11/15/2010  . Chronic, continuous use of opioids 11/06/2013  . Epicondylitis 11/06/2013  . Lateral epicondylitis of both elbows 03/17/2017  . Long term current use of opiate analgesic 02/05/2013  . Medial epicondylitis of both elbows 03/17/2017  . Neck pain 11/15/2010  . Pain in joint, upper arm 11/15/2010  . Sacroiliac joint pain 02/05/2013    Surgical History: Past Surgical History:  Procedure Laterality Date  . SHOULDER SURGERY Right    and clavical    Home Medications:  Allergies as of 06/13/2019   No Known Allergies     Medication List       Accurate as of June 13, 2019  2:26 PM. If you have any questions, ask your nurse or doctor.        amitriptyline 25 MG tablet Commonly known as: ELAVIL   butalbital-acetaminophen-caffeine 50-325-40 MG tablet Commonly known as: Fioricet Take 1-2 tablets by mouth every 6 (six) hours as needed for headache.   morphine 15 MG tablet Commonly known as: MSIR TAKE 1 TABLET BY MOUTH EVERY EIGHT (8) HOURS AS NEEDED FOR PAIN. DO NOT FILL UNTIL 06/08/19   sildenafil 20 MG tablet Commonly known as: REVATIO TAKE 3 TABLETS  (60 MG) ORALLY DAILY AS NEEDED 1 HOUR BEFORE SEX (MAX 3 TABLETS/DAY)   tamsulosin 0.4 MG Caps capsule Commonly known as: FLOMAX Take 2 capsules (0.8 mg total) by mouth daily.       Allergies: No Known Allergies  Family History: No family history on file.  Social History:  reports that he has been smoking cigarettes. He has been smoking about 0.50 packs per day. He has never used smokeless tobacco. He reports that he does not drink alcohol or use drugs.  ROS: UROLOGY Frequent Urination?: Yes Hard to postpone urination?: Yes Burning/pain with urination?: No Get up at night to urinate?: Yes Leakage of urine?: Yes Urine stream starts and stops?: No Trouble starting stream?: No Do you have to strain to urinate?: No Blood in urine?: No Urinary tract infection?: No Sexually transmitted disease?: No Injury to kidneys or bladder?: No Painful intercourse?: No Weak stream?: No Erection problems?: No Penile pain?: No  Gastrointestinal Nausea?: No Vomiting?: No Indigestion/heartburn?: No Diarrhea?: No Constipation?: No  Constitutional Fever: No Night sweats?: No Weight loss?: No Fatigue?: Yes  Skin Skin rash/lesions?: No Itching?: No  Eyes Blurred vision?: No Double vision?: No  Ears/Nose/Throat Sore throat?: No Sinus problems?: No  Hematologic/Lymphatic Swollen glands?: No Easy bruising?: No  Cardiovascular Leg swelling?: No Chest pain?: No  Respiratory Cough?: No Shortness of breath?: No  Endocrine Excessive thirst?: Yes  Musculoskeletal Back pain?: Yes Joint pain?: Yes  Neurological Headaches?: Yes Dizziness?: Yes  Psychologic Depression?: No Anxiety?: Yes  Physical  Exam: BP 133/83 (BP Location: Left Arm, Patient Position: Sitting, Cuff Size: Normal)   Pulse 98   Ht 5\' 7"  (1.702 m)   Wt 161 lb (73 kg)   BMI 25.22 kg/m   Constitutional:  Alert and oriented, No acute distress. HEENT: [place] AT, moist mucus membranes.  Trachea midline, no  masses. Cardiovascular: No clubbing, cyanosis, or edema. Respiratory: Normal respiratory effort, no increased work of breathing. Lymph: No cervical or inguinal lymphadenopathy. Skin: No rashes, bruises or suspicious lesions. Neurologic: Grossly intact, no focal deficits, moving all 4 extremities. Psychiatric: Normal mood and affect.   Assessment & Plan:    - BPH with lower urinary tract symptoms He has noted improvement in his obstructive voiding symptoms however his story delayed voiding symptoms persist.  Will add Myrbetriq 50 mg daily.  He was given 4 weeks worth of samples and will call back regarding medication efficacy.  If symptoms persist we discussed the possibility of outlet surgery however would recommend a urodynamic study due to his significant storage related voiding symptoms and nocturia.   Abbie Sons, [facility] [address] [place], [place] [address] [phone]

## 2019-06-14 ENCOUNTER — Encounter: Payer: Self-pay | Admitting: Urology

## 2019-07-17 ENCOUNTER — Telehealth: Payer: Self-pay | Admitting: Urology

## 2019-07-17 DIAGNOSIS — N401 Enlarged prostate with lower urinary tract symptoms: Secondary | ICD-10-CM

## 2019-07-17 DIAGNOSIS — R350 Frequency of micturition: Secondary | ICD-10-CM

## 2019-07-17 MED ORDER — MIRABEGRON ER 50 MG PO TB24: 50.0000 mg | ORAL_TABLET | Freq: Every day | ORAL | 11 refills | Status: DC

## 2019-07-17 NOTE — Telephone Encounter (Signed)
Rx sent 

## 2019-07-17 NOTE — Telephone Encounter (Signed)
Patient called and asked for a script for Myrbetriq 50 mg to be called in to his pharmacy. He was given samples and said they were working .     Sharyn Lull

## 2019-09-04 DIAGNOSIS — M7702 Medial epicondylitis, left elbow: Secondary | ICD-10-CM | POA: Diagnosis not present

## 2019-09-04 DIAGNOSIS — M7712 Lateral epicondylitis, left elbow: Secondary | ICD-10-CM | POA: Diagnosis not present

## 2019-09-04 DIAGNOSIS — M7711 Lateral epicondylitis, right elbow: Secondary | ICD-10-CM | POA: Diagnosis not present

## 2019-09-04 DIAGNOSIS — M542 Cervicalgia: Secondary | ICD-10-CM | POA: Diagnosis not present

## 2019-09-04 DIAGNOSIS — M533 Sacrococcygeal disorders, not elsewhere classified: Secondary | ICD-10-CM | POA: Diagnosis not present

## 2019-09-04 DIAGNOSIS — M47816 Spondylosis without myelopathy or radiculopathy, lumbar region: Secondary | ICD-10-CM | POA: Diagnosis not present

## 2019-09-04 DIAGNOSIS — Z791 Long term (current) use of non-steroidal anti-inflammatories (NSAID): Secondary | ICD-10-CM | POA: Diagnosis not present

## 2019-09-04 DIAGNOSIS — G894 Chronic pain syndrome: Secondary | ICD-10-CM | POA: Diagnosis not present

## 2019-09-04 DIAGNOSIS — M7701 Medial epicondylitis, right elbow: Secondary | ICD-10-CM | POA: Diagnosis not present

## 2019-09-04 DIAGNOSIS — M1710 Unilateral primary osteoarthritis, unspecified knee: Secondary | ICD-10-CM | POA: Diagnosis not present

## 2019-09-04 DIAGNOSIS — Z79891 Long term (current) use of opiate analgesic: Secondary | ICD-10-CM | POA: Diagnosis not present

## 2019-09-19 ENCOUNTER — Other Ambulatory Visit: Payer: Self-pay | Admitting: Urology

## 2019-11-14 IMAGING — US US RENAL
1 series · 14 of 25 positions shown · non-contrast
Comparison: None

CLINICAL DATA: BILATERAL flank pain, urinary urgency

EXAM:
RENAL / URINARY TRACT ULTRASOUND COMPLETE

[Series 1: us renal · 14 of 46 slices shown]
[im 1/46]
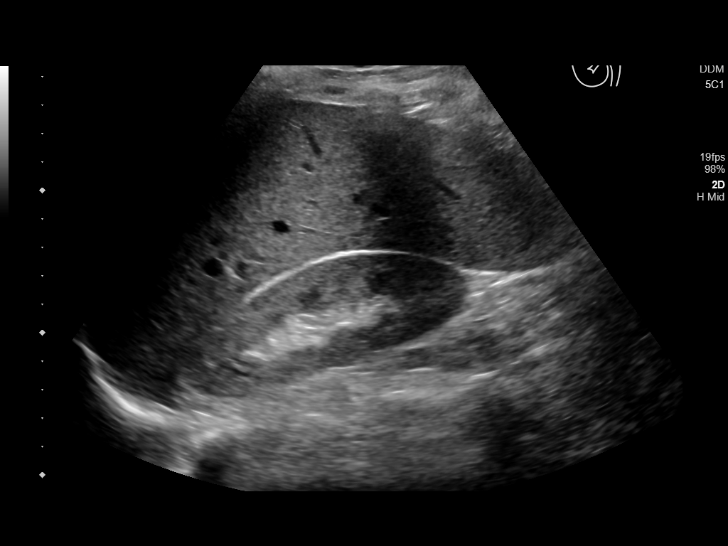
[im 4/46]
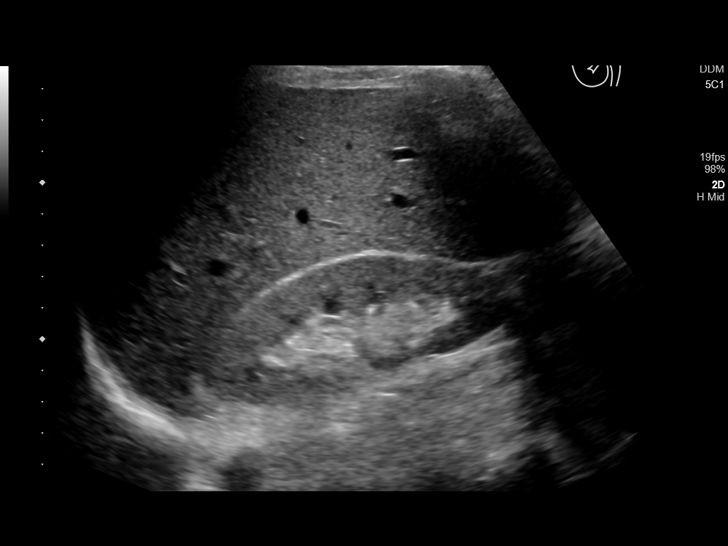
[im 8/46]
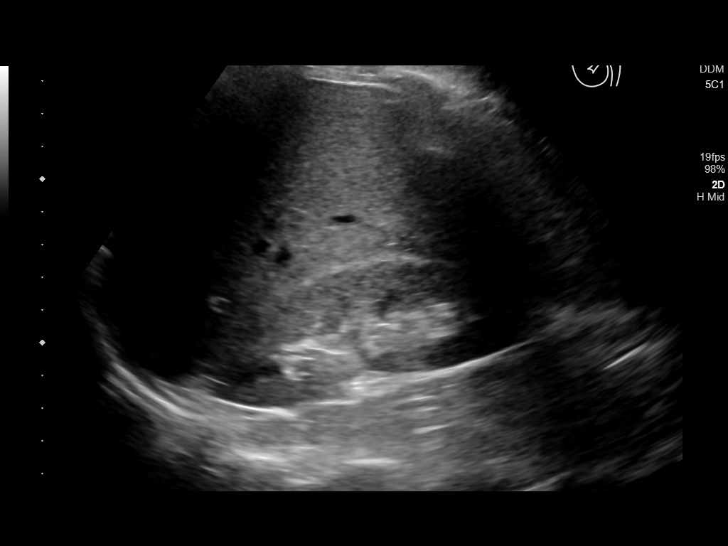
[im 12/46]
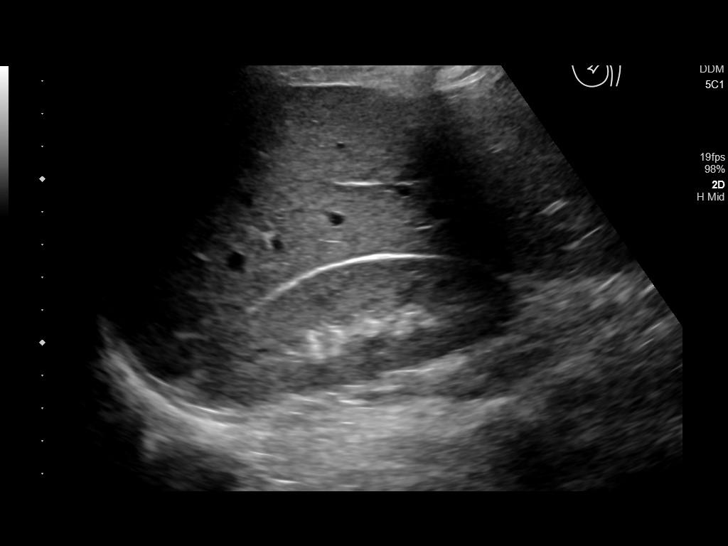
[im 16/46]
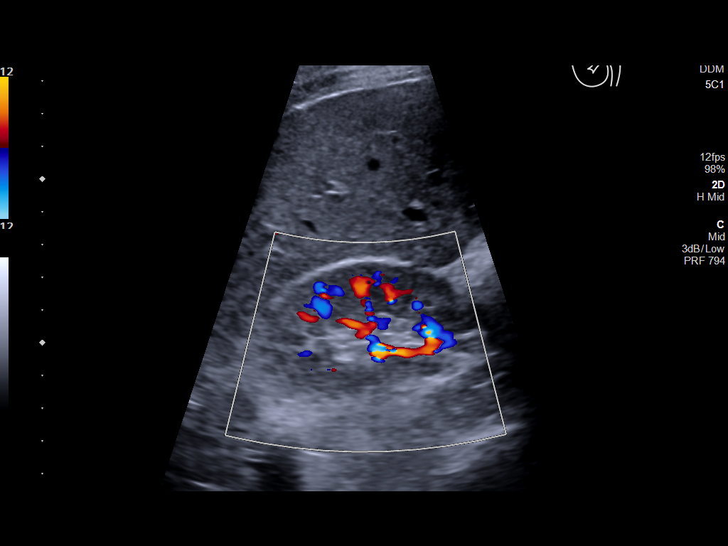
[im 17/46]
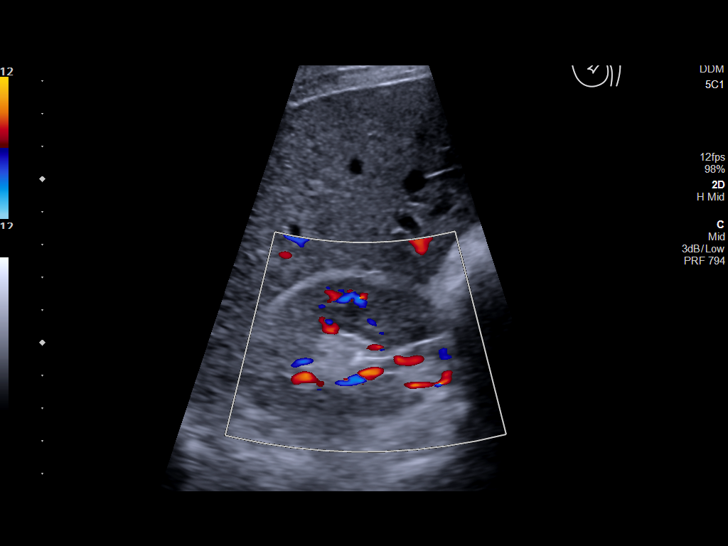
[im 21/46]
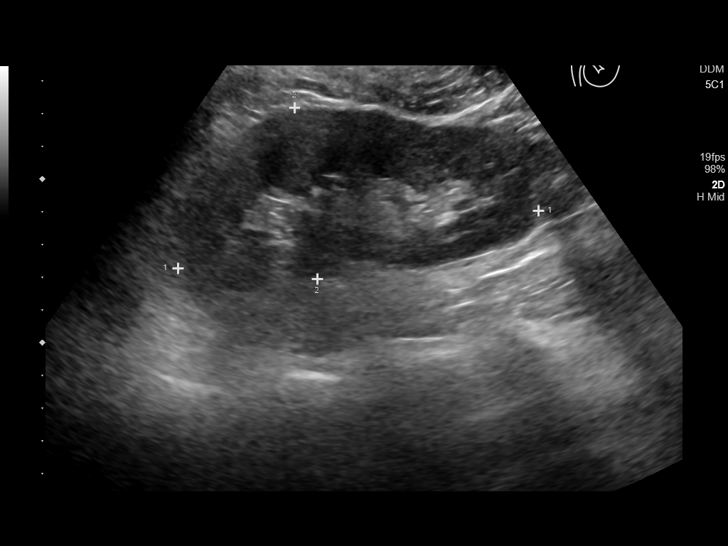
[im 25/46]
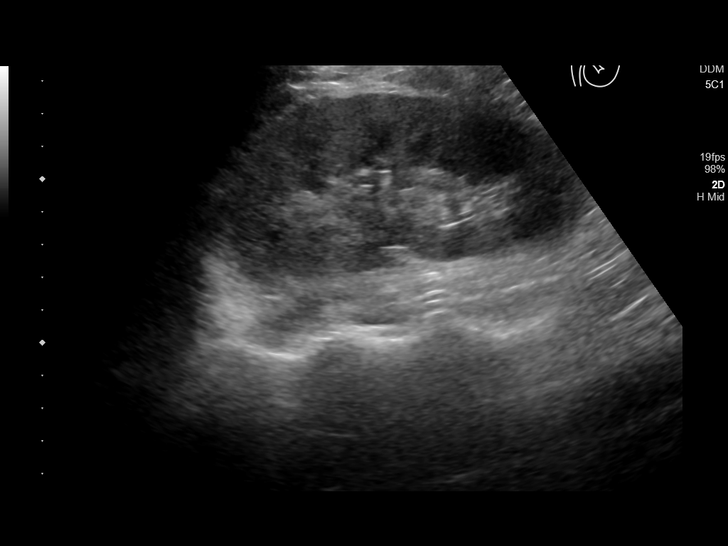
[im 29/46]
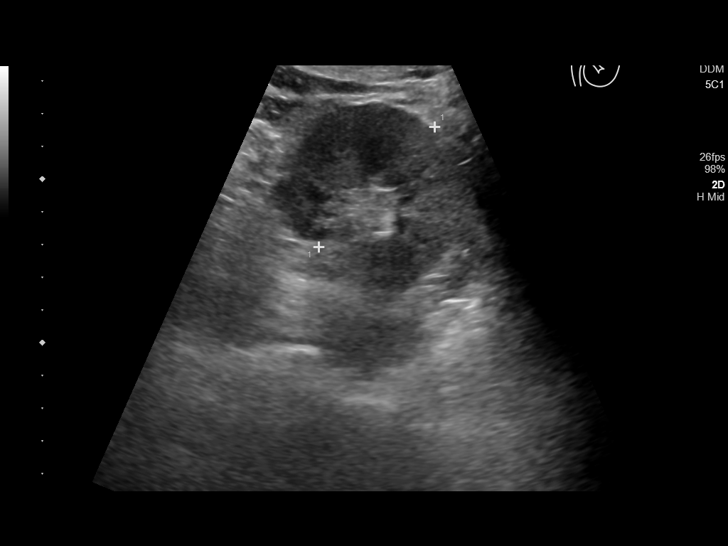
[im 31/46]
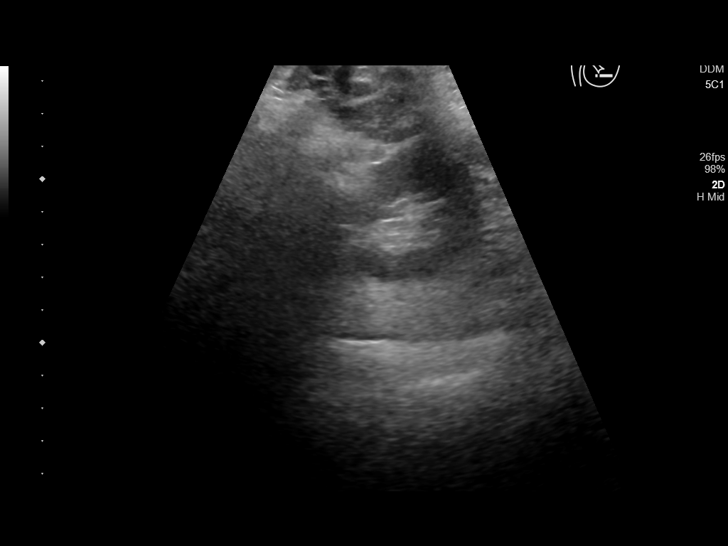
[im 34/46]
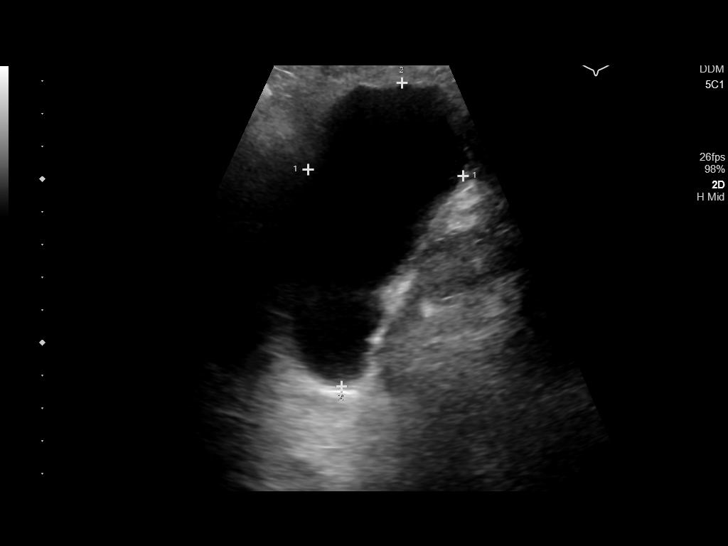
[im 38/46]
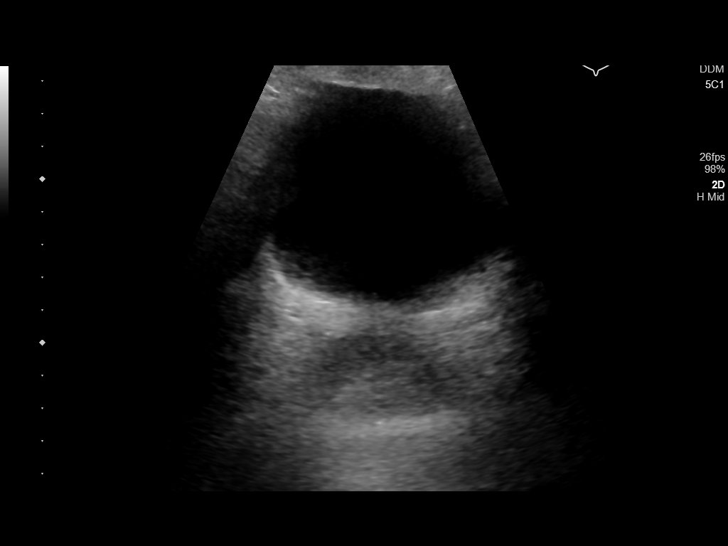
[im 42/46]
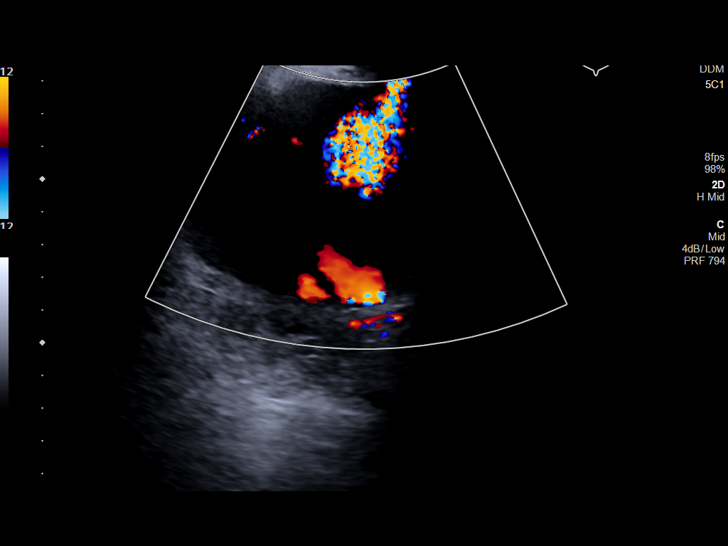
[im 46/46]
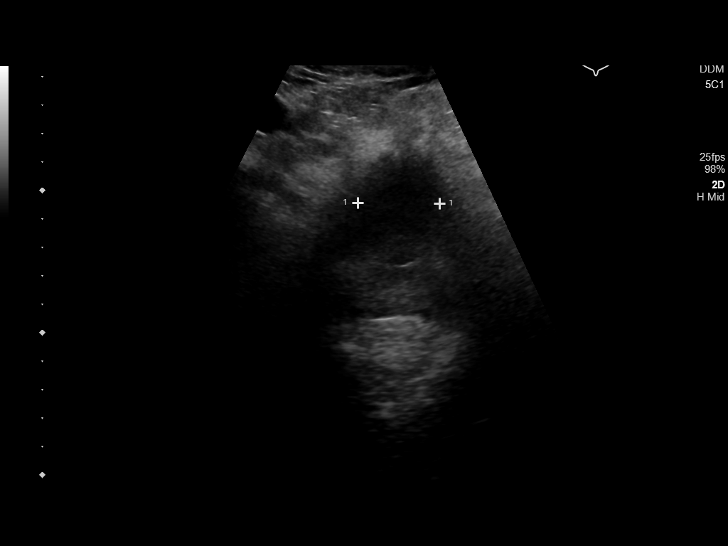

[14 of 25 positions shown; findings below may reference images not displayed]

FINDINGS: Right Kidney:

Renal measurements: 10.3 x 3.3 x 5.9 cm = volume: 107 mL . Normal
cortical thickness and echogenicity. No mass, hydronephrosis or
shadowing calcification.

Left Kidney:

Renal measurements: 11.2 x 5.3 x 5.1 cm = volume: 157 mL. Normal
cortical thickness and echogenicity. No mass, hydronephrosis or
shadowing calcification.

Bladder:

Normally distended without mass or gross wall thickening. BILATERAL
ureteral jets visualized. Minimal postvoid residual volume of 10 mL
from calculated prevoid volume of 159 mL.
IMPRESSION: Minimal postvoid residual bladder volume.

Otherwise normal renal ultrasound.

## 2019-12-03 DIAGNOSIS — Z79891 Long term (current) use of opiate analgesic: Secondary | ICD-10-CM | POA: Diagnosis not present

## 2019-12-03 DIAGNOSIS — G894 Chronic pain syndrome: Secondary | ICD-10-CM | POA: Diagnosis not present

## 2019-12-03 DIAGNOSIS — M533 Sacrococcygeal disorders, not elsewhere classified: Secondary | ICD-10-CM | POA: Diagnosis not present

## 2019-12-03 DIAGNOSIS — M47816 Spondylosis without myelopathy or radiculopathy, lumbar region: Secondary | ICD-10-CM | POA: Diagnosis not present

## 2019-12-28 DIAGNOSIS — M533 Sacrococcygeal disorders, not elsewhere classified: Secondary | ICD-10-CM | POA: Diagnosis not present

## 2020-02-27 DIAGNOSIS — M47816 Spondylosis without myelopathy or radiculopathy, lumbar region: Secondary | ICD-10-CM | POA: Diagnosis not present

## 2020-02-27 DIAGNOSIS — G894 Chronic pain syndrome: Secondary | ICD-10-CM | POA: Diagnosis not present

## 2020-02-27 DIAGNOSIS — M533 Sacrococcygeal disorders, not elsewhere classified: Secondary | ICD-10-CM | POA: Diagnosis not present

## 2020-02-27 DIAGNOSIS — Z79891 Long term (current) use of opiate analgesic: Secondary | ICD-10-CM | POA: Diagnosis not present

## 2020-05-29 DIAGNOSIS — G894 Chronic pain syndrome: Secondary | ICD-10-CM | POA: Diagnosis not present

## 2020-05-29 DIAGNOSIS — M47816 Spondylosis without myelopathy or radiculopathy, lumbar region: Secondary | ICD-10-CM | POA: Diagnosis not present

## 2020-05-29 DIAGNOSIS — Z79891 Long term (current) use of opiate analgesic: Secondary | ICD-10-CM | POA: Diagnosis not present

## 2020-05-29 DIAGNOSIS — M533 Sacrococcygeal disorders, not elsewhere classified: Secondary | ICD-10-CM | POA: Diagnosis not present

## 2020-05-29 DIAGNOSIS — M542 Cervicalgia: Secondary | ICD-10-CM | POA: Diagnosis not present

## 2020-07-07 DIAGNOSIS — Z0289 Encounter for other administrative examinations: Secondary | ICD-10-CM | POA: Diagnosis not present

## 2020-07-07 DIAGNOSIS — M25521 Pain in right elbow: Secondary | ICD-10-CM | POA: Diagnosis not present

## 2020-07-07 DIAGNOSIS — Z79891 Long term (current) use of opiate analgesic: Secondary | ICD-10-CM | POA: Diagnosis not present

## 2020-07-07 DIAGNOSIS — G894 Chronic pain syndrome: Secondary | ICD-10-CM | POA: Diagnosis not present

## 2020-07-07 DIAGNOSIS — M47816 Spondylosis without myelopathy or radiculopathy, lumbar region: Secondary | ICD-10-CM | POA: Diagnosis not present

## 2020-09-23 DIAGNOSIS — M25572 Pain in left ankle and joints of left foot: Secondary | ICD-10-CM | POA: Diagnosis not present

## 2020-09-23 DIAGNOSIS — M25521 Pain in right elbow: Secondary | ICD-10-CM | POA: Diagnosis not present

## 2020-09-23 DIAGNOSIS — M7918 Myalgia, other site: Secondary | ICD-10-CM | POA: Diagnosis not present

## 2020-09-23 DIAGNOSIS — G894 Chronic pain syndrome: Secondary | ICD-10-CM | POA: Diagnosis not present

## 2020-09-23 DIAGNOSIS — M25522 Pain in left elbow: Secondary | ICD-10-CM | POA: Diagnosis not present

## 2020-09-23 DIAGNOSIS — M25529 Pain in unspecified elbow: Secondary | ICD-10-CM | POA: Diagnosis not present

## 2020-09-23 DIAGNOSIS — F419 Anxiety disorder, unspecified: Secondary | ICD-10-CM | POA: Diagnosis not present

## 2020-09-23 DIAGNOSIS — F1190 Opioid use, unspecified, uncomplicated: Secondary | ICD-10-CM | POA: Diagnosis not present

## 2020-09-23 DIAGNOSIS — S59901A Unspecified injury of right elbow, initial encounter: Secondary | ICD-10-CM | POA: Diagnosis not present

## 2020-12-16 DIAGNOSIS — G894 Chronic pain syndrome: Secondary | ICD-10-CM | POA: Diagnosis not present

## 2020-12-16 DIAGNOSIS — M542 Cervicalgia: Secondary | ICD-10-CM | POA: Diagnosis not present

## 2020-12-16 DIAGNOSIS — F1190 Opioid use, unspecified, uncomplicated: Secondary | ICD-10-CM | POA: Diagnosis not present

## 2020-12-16 DIAGNOSIS — M47816 Spondylosis without myelopathy or radiculopathy, lumbar region: Secondary | ICD-10-CM | POA: Diagnosis not present

## 2021-03-12 DIAGNOSIS — M25529 Pain in unspecified elbow: Secondary | ICD-10-CM | POA: Diagnosis not present

## 2021-03-12 DIAGNOSIS — M542 Cervicalgia: Secondary | ICD-10-CM | POA: Diagnosis not present

## 2021-03-12 DIAGNOSIS — M7918 Myalgia, other site: Secondary | ICD-10-CM | POA: Diagnosis not present

## 2021-03-12 DIAGNOSIS — G894 Chronic pain syndrome: Secondary | ICD-10-CM | POA: Diagnosis not present

## 2021-03-12 DIAGNOSIS — Z79891 Long term (current) use of opiate analgesic: Secondary | ICD-10-CM | POA: Diagnosis not present

## 2021-03-12 DIAGNOSIS — M47816 Spondylosis without myelopathy or radiculopathy, lumbar region: Secondary | ICD-10-CM | POA: Diagnosis not present

## 2021-06-02 DIAGNOSIS — Z79891 Long term (current) use of opiate analgesic: Secondary | ICD-10-CM | POA: Diagnosis not present

## 2021-06-02 DIAGNOSIS — M7918 Myalgia, other site: Secondary | ICD-10-CM | POA: Diagnosis not present

## 2021-06-02 DIAGNOSIS — G894 Chronic pain syndrome: Secondary | ICD-10-CM | POA: Diagnosis not present

## 2021-06-02 DIAGNOSIS — M47816 Spondylosis without myelopathy or radiculopathy, lumbar region: Secondary | ICD-10-CM | POA: Diagnosis not present

## 2021-08-05 DIAGNOSIS — R0781 Pleurodynia: Secondary | ICD-10-CM | POA: Diagnosis not present

## 2021-08-05 DIAGNOSIS — S2241XA Multiple fractures of ribs, right side, initial encounter for closed fracture: Secondary | ICD-10-CM | POA: Diagnosis not present

## 2021-08-05 DIAGNOSIS — G894 Chronic pain syndrome: Secondary | ICD-10-CM | POA: Diagnosis not present

## 2021-08-05 DIAGNOSIS — D171 Benign lipomatous neoplasm of skin and subcutaneous tissue of trunk: Secondary | ICD-10-CM | POA: Diagnosis not present

## 2021-08-05 DIAGNOSIS — M542 Cervicalgia: Secondary | ICD-10-CM | POA: Diagnosis not present

## 2021-08-05 DIAGNOSIS — M5030 Other cervical disc degeneration, unspecified cervical region: Secondary | ICD-10-CM | POA: Diagnosis not present

## 2021-09-04 DIAGNOSIS — M7918 Myalgia, other site: Secondary | ICD-10-CM | POA: Diagnosis not present

## 2021-09-04 DIAGNOSIS — M47816 Spondylosis without myelopathy or radiculopathy, lumbar region: Secondary | ICD-10-CM | POA: Diagnosis not present

## 2021-09-04 DIAGNOSIS — F1190 Opioid use, unspecified, uncomplicated: Secondary | ICD-10-CM | POA: Diagnosis not present

## 2021-09-04 DIAGNOSIS — G894 Chronic pain syndrome: Secondary | ICD-10-CM | POA: Diagnosis not present

## 2021-12-03 DIAGNOSIS — G894 Chronic pain syndrome: Secondary | ICD-10-CM | POA: Diagnosis not present

## 2021-12-03 DIAGNOSIS — Z79891 Long term (current) use of opiate analgesic: Secondary | ICD-10-CM | POA: Diagnosis not present

## 2021-12-03 DIAGNOSIS — F1190 Opioid use, unspecified, uncomplicated: Secondary | ICD-10-CM | POA: Diagnosis not present

## 2021-12-03 DIAGNOSIS — F410 Panic disorder [episodic paroxysmal anxiety] without agoraphobia: Secondary | ICD-10-CM | POA: Diagnosis not present

## 2021-12-03 DIAGNOSIS — M47816 Spondylosis without myelopathy or radiculopathy, lumbar region: Secondary | ICD-10-CM | POA: Diagnosis not present

## 2021-12-03 DIAGNOSIS — Z720 Tobacco use: Secondary | ICD-10-CM | POA: Diagnosis not present

## 2021-12-03 DIAGNOSIS — F330 Major depressive disorder, recurrent, mild: Secondary | ICD-10-CM | POA: Diagnosis not present

## 2021-12-03 DIAGNOSIS — M25529 Pain in unspecified elbow: Secondary | ICD-10-CM | POA: Diagnosis not present

## 2021-12-29 DIAGNOSIS — Z79891 Long term (current) use of opiate analgesic: Secondary | ICD-10-CM | POA: Diagnosis not present

## 2021-12-29 DIAGNOSIS — M7061 Trochanteric bursitis, right hip: Secondary | ICD-10-CM | POA: Diagnosis not present

## 2021-12-29 DIAGNOSIS — M47816 Spondylosis without myelopathy or radiculopathy, lumbar region: Secondary | ICD-10-CM | POA: Diagnosis not present

## 2021-12-29 DIAGNOSIS — Z720 Tobacco use: Secondary | ICD-10-CM | POA: Diagnosis not present

## 2021-12-29 DIAGNOSIS — G894 Chronic pain syndrome: Secondary | ICD-10-CM | POA: Diagnosis not present

## 2021-12-29 DIAGNOSIS — F1190 Opioid use, unspecified, uncomplicated: Secondary | ICD-10-CM | POA: Diagnosis not present

## 2021-12-29 DIAGNOSIS — F410 Panic disorder [episodic paroxysmal anxiety] without agoraphobia: Secondary | ICD-10-CM | POA: Diagnosis not present

## 2021-12-29 DIAGNOSIS — F330 Major depressive disorder, recurrent, mild: Secondary | ICD-10-CM | POA: Diagnosis not present

## 2022-01-14 DIAGNOSIS — M7061 Trochanteric bursitis, right hip: Secondary | ICD-10-CM | POA: Diagnosis not present

## 2022-01-20 DIAGNOSIS — Z79899 Other long term (current) drug therapy: Secondary | ICD-10-CM | POA: Diagnosis not present

## 2022-01-20 DIAGNOSIS — Z049 Encounter for examination and observation for unspecified reason: Secondary | ICD-10-CM | POA: Diagnosis not present

## 2022-01-20 DIAGNOSIS — R519 Headache, unspecified: Secondary | ICD-10-CM | POA: Diagnosis not present

## 2022-02-02 DIAGNOSIS — M542 Cervicalgia: Secondary | ICD-10-CM | POA: Diagnosis not present

## 2022-02-02 DIAGNOSIS — G518 Other disorders of facial nerve: Secondary | ICD-10-CM | POA: Diagnosis not present

## 2022-02-02 DIAGNOSIS — R519 Headache, unspecified: Secondary | ICD-10-CM | POA: Diagnosis not present

## 2022-02-02 DIAGNOSIS — M791 Myalgia, unspecified site: Secondary | ICD-10-CM | POA: Diagnosis not present

## 2022-02-16 DIAGNOSIS — M542 Cervicalgia: Secondary | ICD-10-CM | POA: Diagnosis not present

## 2022-02-16 DIAGNOSIS — G518 Other disorders of facial nerve: Secondary | ICD-10-CM | POA: Diagnosis not present

## 2022-02-16 DIAGNOSIS — M791 Myalgia, unspecified site: Secondary | ICD-10-CM | POA: Diagnosis not present

## 2022-02-16 DIAGNOSIS — R519 Headache, unspecified: Secondary | ICD-10-CM | POA: Diagnosis not present

## 2022-03-01 DIAGNOSIS — M7918 Myalgia, other site: Secondary | ICD-10-CM | POA: Diagnosis not present

## 2022-03-01 DIAGNOSIS — G894 Chronic pain syndrome: Secondary | ICD-10-CM | POA: Diagnosis not present

## 2022-03-01 DIAGNOSIS — M47816 Spondylosis without myelopathy or radiculopathy, lumbar region: Secondary | ICD-10-CM | POA: Diagnosis not present

## 2022-03-29 DIAGNOSIS — F410 Panic disorder [episodic paroxysmal anxiety] without agoraphobia: Secondary | ICD-10-CM | POA: Diagnosis not present

## 2022-03-29 DIAGNOSIS — G894 Chronic pain syndrome: Secondary | ICD-10-CM | POA: Diagnosis not present

## 2022-03-29 DIAGNOSIS — Z720 Tobacco use: Secondary | ICD-10-CM | POA: Diagnosis not present

## 2022-03-29 DIAGNOSIS — F330 Major depressive disorder, recurrent, mild: Secondary | ICD-10-CM | POA: Diagnosis not present

## 2022-04-28 DIAGNOSIS — G894 Chronic pain syndrome: Secondary | ICD-10-CM | POA: Diagnosis not present

## 2022-04-28 DIAGNOSIS — M47816 Spondylosis without myelopathy or radiculopathy, lumbar region: Secondary | ICD-10-CM | POA: Diagnosis not present

## 2022-04-28 DIAGNOSIS — M542 Cervicalgia: Secondary | ICD-10-CM | POA: Diagnosis not present

## 2022-04-28 DIAGNOSIS — R519 Headache, unspecified: Secondary | ICD-10-CM | POA: Diagnosis not present

## 2022-04-28 DIAGNOSIS — Z0289 Encounter for other administrative examinations: Secondary | ICD-10-CM | POA: Diagnosis not present

## 2022-05-18 DIAGNOSIS — F410 Panic disorder [episodic paroxysmal anxiety] without agoraphobia: Secondary | ICD-10-CM | POA: Diagnosis not present

## 2022-05-18 DIAGNOSIS — Z720 Tobacco use: Secondary | ICD-10-CM | POA: Diagnosis not present

## 2022-05-18 DIAGNOSIS — F330 Major depressive disorder, recurrent, mild: Secondary | ICD-10-CM | POA: Diagnosis not present

## 2022-05-18 DIAGNOSIS — G894 Chronic pain syndrome: Secondary | ICD-10-CM | POA: Diagnosis not present

## 2022-06-15 DIAGNOSIS — F330 Major depressive disorder, recurrent, mild: Secondary | ICD-10-CM | POA: Diagnosis not present

## 2022-06-15 DIAGNOSIS — F410 Panic disorder [episodic paroxysmal anxiety] without agoraphobia: Secondary | ICD-10-CM | POA: Diagnosis not present

## 2022-06-23 DIAGNOSIS — G894 Chronic pain syndrome: Secondary | ICD-10-CM | POA: Diagnosis not present

## 2022-06-23 DIAGNOSIS — R519 Headache, unspecified: Secondary | ICD-10-CM | POA: Diagnosis not present

## 2022-06-23 DIAGNOSIS — F1190 Opioid use, unspecified, uncomplicated: Secondary | ICD-10-CM | POA: Diagnosis not present

## 2022-06-23 DIAGNOSIS — Z720 Tobacco use: Secondary | ICD-10-CM | POA: Diagnosis not present

## 2022-06-23 DIAGNOSIS — Z0289 Encounter for other administrative examinations: Secondary | ICD-10-CM | POA: Diagnosis not present

## 2022-06-23 DIAGNOSIS — F330 Major depressive disorder, recurrent, mild: Secondary | ICD-10-CM | POA: Diagnosis not present

## 2022-06-23 DIAGNOSIS — F410 Panic disorder [episodic paroxysmal anxiety] without agoraphobia: Secondary | ICD-10-CM | POA: Diagnosis not present

## 2022-06-23 DIAGNOSIS — Z79891 Long term (current) use of opiate analgesic: Secondary | ICD-10-CM | POA: Diagnosis not present

## 2022-07-20 DIAGNOSIS — F410 Panic disorder [episodic paroxysmal anxiety] without agoraphobia: Secondary | ICD-10-CM | POA: Diagnosis not present

## 2022-07-20 DIAGNOSIS — G894 Chronic pain syndrome: Secondary | ICD-10-CM | POA: Diagnosis not present

## 2022-07-20 DIAGNOSIS — F330 Major depressive disorder, recurrent, mild: Secondary | ICD-10-CM | POA: Diagnosis not present

## 2022-07-20 DIAGNOSIS — Z720 Tobacco use: Secondary | ICD-10-CM | POA: Diagnosis not present

## 2022-07-27 DIAGNOSIS — F439 Reaction to severe stress, unspecified: Secondary | ICD-10-CM | POA: Diagnosis not present

## 2022-07-27 DIAGNOSIS — F419 Anxiety disorder, unspecified: Secondary | ICD-10-CM | POA: Diagnosis not present

## 2022-07-27 DIAGNOSIS — R4587 Impulsiveness: Secondary | ICD-10-CM | POA: Diagnosis not present

## 2022-07-27 DIAGNOSIS — S069X0S Unspecified intracranial injury without loss of consciousness, sequela: Secondary | ICD-10-CM | POA: Diagnosis not present

## 2022-07-27 DIAGNOSIS — F410 Panic disorder [episodic paroxysmal anxiety] without agoraphobia: Secondary | ICD-10-CM | POA: Diagnosis not present

## 2022-07-27 DIAGNOSIS — R4689 Other symptoms and signs involving appearance and behavior: Secondary | ICD-10-CM | POA: Diagnosis not present

## 2022-08-12 DIAGNOSIS — R519 Headache, unspecified: Secondary | ICD-10-CM | POA: Diagnosis not present

## 2022-08-12 DIAGNOSIS — F1190 Opioid use, unspecified, uncomplicated: Secondary | ICD-10-CM | POA: Diagnosis not present

## 2022-08-12 DIAGNOSIS — M47816 Spondylosis without myelopathy or radiculopathy, lumbar region: Secondary | ICD-10-CM | POA: Diagnosis not present

## 2022-08-12 DIAGNOSIS — G894 Chronic pain syndrome: Secondary | ICD-10-CM | POA: Diagnosis not present

## 2022-09-21 DIAGNOSIS — F410 Panic disorder [episodic paroxysmal anxiety] without agoraphobia: Secondary | ICD-10-CM | POA: Diagnosis not present

## 2022-09-21 DIAGNOSIS — F1520 Other stimulant dependence, uncomplicated: Secondary | ICD-10-CM | POA: Diagnosis not present

## 2022-09-21 DIAGNOSIS — S069X0S Unspecified intracranial injury without loss of consciousness, sequela: Secondary | ICD-10-CM | POA: Diagnosis not present

## 2022-10-21 DIAGNOSIS — Z79891 Long term (current) use of opiate analgesic: Secondary | ICD-10-CM | POA: Diagnosis not present

## 2022-10-21 DIAGNOSIS — F330 Major depressive disorder, recurrent, mild: Secondary | ICD-10-CM | POA: Diagnosis not present

## 2022-10-21 DIAGNOSIS — F1190 Opioid use, unspecified, uncomplicated: Secondary | ICD-10-CM | POA: Diagnosis not present

## 2022-10-21 DIAGNOSIS — M47816 Spondylosis without myelopathy or radiculopathy, lumbar region: Secondary | ICD-10-CM | POA: Diagnosis not present

## 2022-10-21 DIAGNOSIS — F410 Panic disorder [episodic paroxysmal anxiety] without agoraphobia: Secondary | ICD-10-CM | POA: Diagnosis not present

## 2022-10-21 DIAGNOSIS — F1520 Other stimulant dependence, uncomplicated: Secondary | ICD-10-CM | POA: Diagnosis not present

## 2022-10-21 DIAGNOSIS — G894 Chronic pain syndrome: Secondary | ICD-10-CM | POA: Diagnosis not present

## 2022-10-21 DIAGNOSIS — S069X0S Unspecified intracranial injury without loss of consciousness, sequela: Secondary | ICD-10-CM | POA: Diagnosis not present

## 2022-10-21 DIAGNOSIS — Z720 Tobacco use: Secondary | ICD-10-CM | POA: Diagnosis not present

## 2022-11-16 DIAGNOSIS — S069X0A Unspecified intracranial injury without loss of consciousness, initial encounter: Secondary | ICD-10-CM | POA: Diagnosis not present

## 2022-11-16 DIAGNOSIS — F410 Panic disorder [episodic paroxysmal anxiety] without agoraphobia: Secondary | ICD-10-CM | POA: Diagnosis not present

## 2022-11-16 DIAGNOSIS — F1520 Other stimulant dependence, uncomplicated: Secondary | ICD-10-CM | POA: Diagnosis not present

## 2023-01-03 ENCOUNTER — Ambulatory Visit (INDEPENDENT_AMBULATORY_CARE_PROVIDER_SITE_OTHER): Payer: Medicare HMO | Admitting: Podiatry

## 2023-01-03 DIAGNOSIS — Z91199 Patient's noncompliance with other medical treatment and regimen due to unspecified reason: Secondary | ICD-10-CM

## 2023-01-04 NOTE — Progress Notes (Signed)
Patient was no-show for appointment today 

## 2023-01-11 DIAGNOSIS — F1520 Other stimulant dependence, uncomplicated: Secondary | ICD-10-CM | POA: Diagnosis not present

## 2023-01-11 DIAGNOSIS — S069X0S Unspecified intracranial injury without loss of consciousness, sequela: Secondary | ICD-10-CM | POA: Diagnosis not present

## 2023-01-11 DIAGNOSIS — Z720 Tobacco use: Secondary | ICD-10-CM | POA: Diagnosis not present

## 2023-01-11 DIAGNOSIS — F1190 Opioid use, unspecified, uncomplicated: Secondary | ICD-10-CM | POA: Diagnosis not present

## 2023-01-11 DIAGNOSIS — G894 Chronic pain syndrome: Secondary | ICD-10-CM | POA: Diagnosis not present

## 2023-01-11 DIAGNOSIS — F410 Panic disorder [episodic paroxysmal anxiety] without agoraphobia: Secondary | ICD-10-CM | POA: Diagnosis not present

## 2023-01-11 DIAGNOSIS — M47816 Spondylosis without myelopathy or radiculopathy, lumbar region: Secondary | ICD-10-CM | POA: Diagnosis not present

## 2023-01-11 DIAGNOSIS — F330 Major depressive disorder, recurrent, mild: Secondary | ICD-10-CM | POA: Diagnosis not present

## 2023-02-01 DIAGNOSIS — F410 Panic disorder [episodic paroxysmal anxiety] without agoraphobia: Secondary | ICD-10-CM | POA: Diagnosis not present

## 2023-02-11 DIAGNOSIS — F1190 Opioid use, unspecified, uncomplicated: Secondary | ICD-10-CM | POA: Diagnosis not present

## 2023-02-11 DIAGNOSIS — G894 Chronic pain syndrome: Secondary | ICD-10-CM | POA: Diagnosis not present
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
## 2023-02-18 ENCOUNTER — Ambulatory Visit (INDEPENDENT_AMBULATORY_CARE_PROVIDER_SITE_OTHER): Payer: Medicare HMO | Admitting: Nurse Practitioner

## 2023-02-18 ENCOUNTER — Encounter: Payer: Self-pay | Admitting: Nurse Practitioner

## 2023-02-18 ENCOUNTER — Ambulatory Visit
Admission: RE | Admit: 2023-02-18 | Discharge: 2023-02-18 | Disposition: A | Discharge status: Home / Self Care | Payer: Medicare HMO | Attending: Nurse Practitioner | Admitting: Nurse Practitioner

## 2023-02-18 ENCOUNTER — Ambulatory Visit
Admission: RE | Admit: 2023-02-18 | Discharge: 2023-02-18 | Disposition: A | Discharge status: Home / Self Care | Payer: Medicare HMO | Source: Ambulatory Visit | Attending: Nurse Practitioner | Admitting: Nurse Practitioner

## 2023-02-18 VITALS — BP 112/72 | HR 74 | Temp 97.8°F | Resp 16 | Ht 67.5 in | Wt 154.1 lb

## 2023-02-18 DIAGNOSIS — F1190 Opioid use, unspecified, uncomplicated: Secondary | ICD-10-CM | POA: Diagnosis not present

## 2023-02-18 DIAGNOSIS — F17200 Nicotine dependence, unspecified, uncomplicated: Secondary | ICD-10-CM | POA: Diagnosis not present

## 2023-02-18 DIAGNOSIS — R0781 Pleurodynia: Secondary | ICD-10-CM | POA: Diagnosis not present

## 2023-02-18 DIAGNOSIS — Z125 Encounter for screening for malignant neoplasm of prostate: Secondary | ICD-10-CM | POA: Diagnosis not present

## 2023-02-18 DIAGNOSIS — R222 Localized swelling, mass and lump, trunk: Secondary | ICD-10-CM

## 2023-02-18 DIAGNOSIS — G894 Chronic pain syndrome: Secondary | ICD-10-CM

## 2023-02-18 DIAGNOSIS — R062 Wheezing: Secondary | ICD-10-CM

## 2023-02-18 DIAGNOSIS — Z1159 Encounter for screening for other viral diseases: Secondary | ICD-10-CM

## 2023-02-18 DIAGNOSIS — Z7689 Persons encountering health services in other specified circumstances: Secondary | ICD-10-CM | POA: Diagnosis not present

## 2023-02-18 DIAGNOSIS — Z114 Encounter for screening for human immunodeficiency virus [HIV]: Secondary | ICD-10-CM | POA: Diagnosis not present

## 2023-02-18 DIAGNOSIS — Z1211 Encounter for screening for malignant neoplasm of colon: Secondary | ICD-10-CM

## 2023-02-18 DIAGNOSIS — M542 Cervicalgia: Secondary | ICD-10-CM | POA: Diagnosis not present

## 2023-02-18 DIAGNOSIS — Z130 Encounter for screening for diseases of the blood and blood-forming organs and certain disorders involving the immune mechanism: Secondary | ICD-10-CM

## 2023-02-18 DIAGNOSIS — Z131 Encounter for screening for diabetes mellitus: Secondary | ICD-10-CM

## 2023-02-18 DIAGNOSIS — Z13220 Encounter for screening for lipoid disorders: Secondary | ICD-10-CM | POA: Diagnosis not present

## 2023-02-18 LAB — CBC WITH DIFFERENTIAL/PLATELET
Eosinophils Absolute: 191 cells/uL (ref 15–500)
Eosinophils Relative: 3.3 %
HCT: 44 % (ref 38.5–50.0)
MCH: 31.5 pg (ref 27.0–33.0)
MCV: 92.4 fL (ref 80.0–100.0)
MPV: 8.8 fL (ref 7.5–12.5)
Neutrophils Relative %: 63.8 %
Platelets: 253 10*3/uL (ref 140–400)

## 2023-02-18 MED ORDER — ALBUTEROL SULFATE HFA 108 (90 BASE) MCG/ACT IN AERS
2.0000 | INHALATION_SPRAY | Freq: Four times a day (QID) | RESPIRATORY_TRACT | 0 refills | Status: DC | PRN
Start: 2023-02-18 — End: 2023-02-18

## 2023-02-18 MED ORDER — ALBUTEROL SULFATE HFA 108 (90 BASE) MCG/ACT IN AERS
2.0000 | INHALATION_SPRAY | Freq: Four times a day (QID) | RESPIRATORY_TRACT | 3 refills | Status: AC | PRN
Start: 2023-02-18 — End: ?

## 2023-02-18 NOTE — Assessment & Plan Note (Signed)
Referral placed to spine and pain clinic

## 2023-02-18 NOTE — Assessment & Plan Note (Signed)
Recommend quitting smoking.

## 2023-02-18 NOTE — Assessment & Plan Note (Signed)
Referral placed to spine and pain clinic 

## 2023-02-19 LAB — PSA: PSA: 0.38 ng/mL (ref <=4.00)

## 2023-02-19 LAB — CBC WITH DIFFERENTIAL/PLATELET
Absolute Monocytes: 319 cells/uL (ref 200–950)
Basophils Absolute: 52 cells/uL (ref 0–200)
Basophils Relative: 0.9 %
Hemoglobin: 15 g/dL (ref 13.2–17.1)
Lymphs Abs: 1537 cells/uL (ref 850–3900)
MCHC: 34.1 g/dL (ref 32.0–36.0)
Monocytes Relative: 5.5 %
Neutro Abs: 3700 cells/uL (ref 1500–7800)
RBC: 4.76 10*6/uL (ref 4.20–5.80)
RDW: 13.1 % (ref 11.0–15.0)
Total Lymphocyte: 26.5 %
WBC: 5.8 10*3/uL (ref 3.8–10.8)

## 2023-02-19 LAB — COMPLETE METABOLIC PANEL WITH GFR
AG Ratio: 2 (calc) (ref 1.0–2.5)
ALT: 31 U/L (ref 9–46)
AST: 34 U/L (ref 10–35)
Albumin: 4 g/dL (ref 3.6–5.1)
Alkaline phosphatase (APISO): 90 U/L (ref 35–144)
BUN: 9 mg/dL (ref 7–25)
CO2: 28 mmol/L (ref 20–32)
Calcium: 9 mg/dL (ref 8.6–10.3)
Chloride: 104 mmol/L (ref 98–110)
Creat: 1.15 mg/dL (ref 0.70–1.30)
Globulin: 2 g/dL (calc) (ref 1.9–3.7)
Glucose, Bld: 111 mg/dL — ABNORMAL HIGH (ref 65–99)
Potassium: 3.8 mmol/L (ref 3.5–5.3)
Sodium: 141 mmol/L (ref 135–146)
Total Bilirubin: 0.3 mg/dL (ref 0.2–1.2)
Total Protein: 6 g/dL — ABNORMAL LOW (ref 6.1–8.1)
eGFR: 77 mL/min/{1.73_m2} (ref >=60)

## 2023-02-19 LAB — LIPID PANEL
Cholesterol: 110 mg/dL (ref <200)
HDL: 44 mg/dL (ref >=40)
LDL Cholesterol (Calc): 52 mg/dL (calc)
Non-HDL Cholesterol (Calc): 66 mg/dL (calc) (ref <130)
Total CHOL/HDL Ratio: 2.5 (calc) (ref <5.0)
Triglycerides: 64 mg/dL (ref <150)

## 2023-02-19 LAB — HEMOGLOBIN A1C
Hgb A1c MFr Bld: 5.4 % of total Hgb (ref <5.7)
Mean Plasma Glucose: 108 mg/dL
eAG (mmol/L): 6 mmol/L

## 2023-02-19 LAB — HIV ANTIBODY (ROUTINE TESTING W REFLEX): HIV 1&2 Ab, 4th Generation: NONREACTIVE

## 2023-02-19 LAB — HEPATITIS C ANTIBODY: Hepatitis C Ab: NONREACTIVE

## 2023-02-25 ENCOUNTER — Other Ambulatory Visit: Payer: Self-pay | Admitting: Nurse Practitioner

## 2023-02-25 DIAGNOSIS — R062 Wheezing: Secondary | ICD-10-CM

## 2023-02-28 ENCOUNTER — Encounter: Payer: Self-pay | Admitting: *Deleted

## 2023-03-08 DIAGNOSIS — F1520 Other stimulant dependence, uncomplicated: Secondary | ICD-10-CM | POA: Diagnosis not present

## 2023-03-08 DIAGNOSIS — F410 Panic disorder [episodic paroxysmal anxiety] without agoraphobia: Secondary | ICD-10-CM | POA: Diagnosis not present

## 2023-03-08 DIAGNOSIS — F1190 Opioid use, unspecified, uncomplicated: Secondary | ICD-10-CM | POA: Diagnosis not present

## 2023-03-08 DIAGNOSIS — Z720 Tobacco use: Secondary | ICD-10-CM | POA: Diagnosis not present

## 2023-03-08 DIAGNOSIS — S069X0S Unspecified intracranial injury without loss of consciousness, sequela: Secondary | ICD-10-CM | POA: Diagnosis not present

## 2023-03-08 DIAGNOSIS — F330 Major depressive disorder, recurrent, mild: Secondary | ICD-10-CM | POA: Diagnosis not present

## 2023-03-09 DIAGNOSIS — F1190 Opioid use, unspecified, uncomplicated: Secondary | ICD-10-CM | POA: Diagnosis not present

## 2023-03-09 DIAGNOSIS — M791 Myalgia, unspecified site: Secondary | ICD-10-CM | POA: Diagnosis not present

## 2023-03-09 DIAGNOSIS — R519 Headache, unspecified: Secondary | ICD-10-CM | POA: Diagnosis not present

## 2023-03-09 DIAGNOSIS — M542 Cervicalgia: Secondary | ICD-10-CM | POA: Diagnosis not present

## 2023-03-09 DIAGNOSIS — G894 Chronic pain syndrome: Secondary | ICD-10-CM | POA: Diagnosis not present

## 2023-03-09 DIAGNOSIS — M47816 Spondylosis without myelopathy or radiculopathy, lumbar region: Secondary | ICD-10-CM | POA: Diagnosis not present

## 2023-03-21 ENCOUNTER — Ambulatory Visit: Payer: Medicare HMO

## 2023-03-22 DIAGNOSIS — F1520 Other stimulant dependence, uncomplicated: Secondary | ICD-10-CM | POA: Diagnosis not present

## 2023-03-22 DIAGNOSIS — F410 Panic disorder [episodic paroxysmal anxiety] without agoraphobia: Secondary | ICD-10-CM | POA: Diagnosis not present

## 2023-03-22 DIAGNOSIS — T43614A Poisoning by caffeine, undetermined, initial encounter: Secondary | ICD-10-CM | POA: Diagnosis not present

## 2023-04-05 DIAGNOSIS — M791 Myalgia, unspecified site: Secondary | ICD-10-CM | POA: Diagnosis not present

## 2023-04-12 DIAGNOSIS — G8929 Other chronic pain: Secondary | ICD-10-CM | POA: Diagnosis not present

## 2023-04-12 DIAGNOSIS — R0781 Pleurodynia: Secondary | ICD-10-CM | POA: Diagnosis not present

## 2023-04-12 DIAGNOSIS — M542 Cervicalgia: Secondary | ICD-10-CM | POA: Diagnosis not present

## 2023-04-12 DIAGNOSIS — M545 Low back pain, unspecified: Secondary | ICD-10-CM | POA: Diagnosis not present

## 2023-04-15 DIAGNOSIS — M542 Cervicalgia: Secondary | ICD-10-CM | POA: Diagnosis not present

## 2023-04-15 DIAGNOSIS — M47816 Spondylosis without myelopathy or radiculopathy, lumbar region: Secondary | ICD-10-CM | POA: Diagnosis not present

## 2023-04-15 DIAGNOSIS — M7918 Myalgia, other site: Secondary | ICD-10-CM | POA: Diagnosis not present

## 2023-05-24 ENCOUNTER — Institutional Professional Consult (permissible substitution): Payer: Medicare HMO | Admitting: Pulmonary Disease

## 2023-06-01 ENCOUNTER — Encounter: Payer: Self-pay | Admitting: Nurse Practitioner

## 2023-06-01 ENCOUNTER — Other Ambulatory Visit: Payer: Self-pay

## 2023-06-01 ENCOUNTER — Ambulatory Visit (INDEPENDENT_AMBULATORY_CARE_PROVIDER_SITE_OTHER): Payer: Medicare HMO | Admitting: Nurse Practitioner

## 2023-06-01 VITALS — BP 124/76 | HR 70 | Temp 97.8°F | Resp 18 | Ht 67.5 in | Wt 160.4 lb

## 2023-06-01 DIAGNOSIS — G894 Chronic pain syndrome: Secondary | ICD-10-CM

## 2023-06-01 DIAGNOSIS — F330 Major depressive disorder, recurrent, mild: Secondary | ICD-10-CM

## 2023-06-01 DIAGNOSIS — F1190 Opioid use, unspecified, uncomplicated: Secondary | ICD-10-CM

## 2023-06-01 DIAGNOSIS — M47816 Spondylosis without myelopathy or radiculopathy, lumbar region: Secondary | ICD-10-CM

## 2023-06-01 DIAGNOSIS — F419 Anxiety disorder, unspecified: Secondary | ICD-10-CM

## 2023-06-01 NOTE — Assessment & Plan Note (Signed)
Referral placed to pain management. Discussed with patient that he would need to follow up with pain management for refills and changes to medication.
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
## 2023-06-01 NOTE — Assessment & Plan Note (Signed)
Seeing psychiatrist with pain management. He is currently on risperidone 0.25 mg daily.  Will not be giving him benzodiazepines.

## 2023-06-06 ENCOUNTER — Telehealth: Payer: Self-pay | Admitting: Nurse Practitioner

## 2023-06-06 NOTE — Telephone Encounter (Signed)
Pt is calling to report that [facility] is requesting [facility] to be [phone]  Phone- [phone]

## 2023-06-06 NOTE — Telephone Encounter (Signed)
Order faxed.

## 2023-07-15 ENCOUNTER — Telehealth: Payer: Self-pay | Admitting: Nurse Practitioner

## 2023-07-15 NOTE — Telephone Encounter (Signed)
Copied from CRM [id number]. Topic: Referral - Question >> Jul 15, 2023 10:14 AM Everette C wrote: Reason for CRM: The patient has called and requested that a member of staff reach out to [facility]errals dept at [phone] and request to please speak with Ines Bloomer or [facility] regarding a referral for the patient   The patient has been in contact with [facility] regarding a previously discussed referral for pain and neuro concerns   The patient has been directed by [facility] to contact their PCP and provide this number for contact and further discussion   Please contact when available

## 2023-07-18 ENCOUNTER — Other Ambulatory Visit: Payer: Self-pay

## 2023-07-18 ENCOUNTER — Encounter: Payer: Self-pay | Admitting: Nurse Practitioner

## 2023-07-18 ENCOUNTER — Ambulatory Visit (INDEPENDENT_AMBULATORY_CARE_PROVIDER_SITE_OTHER): Payer: Medicare HMO | Admitting: Nurse Practitioner

## 2023-07-18 VITALS — BP 124/78 | HR 82 | Temp 97.7°F | Resp 18 | Ht 67.5 in | Wt 161.3 lb

## 2023-07-18 DIAGNOSIS — F1190 Opioid use, unspecified, uncomplicated: Secondary | ICD-10-CM | POA: Diagnosis not present

## 2023-07-18 DIAGNOSIS — G894 Chronic pain syndrome: Secondary | ICD-10-CM

## 2023-07-18 DIAGNOSIS — M47816 Spondylosis without myelopathy or radiculopathy, lumbar region: Secondary | ICD-10-CM | POA: Diagnosis not present

## 2023-07-18 NOTE — Patient Instructions (Signed)
[facility] Address: [address] [place], [place] [address] Phone: [phone]
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
## 2023-07-18 NOTE — Telephone Encounter (Signed)
Left message for Paul Salinas to call office back

## 2023-08-01 ENCOUNTER — Encounter: Payer: Self-pay | Admitting: Nurse Practitioner

## 2023-08-01 ENCOUNTER — Telehealth: Payer: Self-pay

## 2023-08-01 NOTE — Telephone Encounter (Signed)
Returned call to patient about pain management referral, number given for scheduling. Patient will call back with any problems, questions or concerns.

## 2023-08-09 ENCOUNTER — Encounter: Payer: Self-pay | Admitting: Nurse Practitioner

## 2023-08-22 ENCOUNTER — Ambulatory Visit (INDEPENDENT_AMBULATORY_CARE_PROVIDER_SITE_OTHER): Payer: Medicare HMO | Admitting: Nurse Practitioner

## 2023-08-22 VITALS — BP 128/74 | HR 83 | Resp 16 | Ht 67.0 in | Wt 159.0 lb

## 2023-08-22 DIAGNOSIS — G894 Chronic pain syndrome: Secondary | ICD-10-CM | POA: Diagnosis not present

## 2023-08-22 DIAGNOSIS — T23201A Burn of second degree of right hand, unspecified site, initial encounter: Secondary | ICD-10-CM

## 2023-08-22 DIAGNOSIS — F1190 Opioid use, unspecified, uncomplicated: Secondary | ICD-10-CM | POA: Diagnosis not present

## 2023-08-22 DIAGNOSIS — T300 Burn of unspecified body region, unspecified degree: Secondary | ICD-10-CM

## 2023-08-22 DIAGNOSIS — R062 Wheezing: Secondary | ICD-10-CM | POA: Insufficient documentation

## 2023-08-22 DIAGNOSIS — M47816 Spondylosis without myelopathy or radiculopathy, lumbar region: Secondary | ICD-10-CM | POA: Diagnosis not present

## 2023-08-22 DIAGNOSIS — F330 Major depressive disorder, recurrent, mild: Secondary | ICD-10-CM | POA: Diagnosis not present

## 2023-08-22 DIAGNOSIS — F419 Anxiety disorder, unspecified: Secondary | ICD-10-CM
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
## 2023-11-04 ENCOUNTER — Ambulatory Visit: Payer: Medicare HMO | Admitting: Nurse Practitioner

## 2024-02-06 ENCOUNTER — Telehealth: Payer: Self-pay | Admitting: Nurse Practitioner

## 2024-02-06 NOTE — Telephone Encounter (Signed)
 Paul Salinas will not be the office on 02/10/2024. Please call the office to reschedule your New Patient appointment.   E2C2 please schedule.

## 2024-02-10 ENCOUNTER — Ambulatory Visit: Admitting: Nurse Practitioner

## 2024-02-20 ENCOUNTER — Encounter: Payer: Self-pay | Admitting: Nurse Practitioner

## 2024-02-20 NOTE — Progress Notes (Deleted)
 Name: Paul Salinas   MRN: [id number]    DOB: 1971-04-14   Date:02/20/2024       Progress Note  Subjective  Chief Complaint  No chief complaint on file.   HPI  Discussed the use of AI scribe software for clinical note transcription with the patient, who gave verbal consent to proceed.  History of Present Illness    Patient presents for annual CPE .    Diet: *** Exercise: *** Sleep: *** Last dental exam:*** Last eye exam: ***  Depression: phq 9 is {gen pos neg:[id number]}    07/18/2023   11:18 AM 07/18/2023   10:56 AM 06/01/2023   10:33 AM 02/18/2023    8:42 AM  Depression screen PHQ 2/9  Decreased Interest 2 0 2 0  Down, Depressed, Hopeless 1 0 1 0  PHQ - 2 Score 3 0 3 0  Altered sleeping 2  1 0  Tired, decreased energy 1  1 0  Change in appetite 0  0 0  Feeling bad or failure about yourself  1  1 0  Trouble concentrating 1  1 0  Moving slowly or fidgety/restless 0  0 0  Suicidal thoughts 0  0 0  PHQ-9 Score 8  7 0  Difficult doing work/chores Very difficult  Somewhat difficult Not difficult at all    Hypertension:  BP Readings from Last 3 Encounters:  08/22/23 128/74  07/18/23 124/78  06/01/23 124/76    Obesity: Wt Readings from Last 3 Encounters:  08/22/23 159 lb (72.1 kg)  07/18/23 161 lb 4.8 oz (73.2 kg)  06/01/23 160 lb 6.4 oz (72.8 kg)   BMI Readings from Last 3 Encounters:  08/22/23 24.90 kg/m  07/18/23 24.89 kg/m  06/01/23 24.75 kg/m     Lipids:  Lab Results  Component Value Date   CHOL 110 02/18/2023   Lab Results  Component Value Date   HDL 44 02/18/2023   Lab Results  Component Value Date   LDLCALC 52 02/18/2023   Lab Results  Component Value Date   TRIG 64 02/18/2023   Lab Results  Component Value Date   CHOLHDL 2.5 02/18/2023   No results found for: LDLDIRECT Glucose:  Glucose, Bld  Date Value Ref Range Status  02/18/2023 111 (H) 65 - 99 mg/dL Final    Comment:    .            Fasting reference interval . For  someone without known diabetes, a glucose value between 100 and 125 mg/dL is consistent with prediabetes and should be confirmed with a follow-up test. .     Flowsheet Row Office Visit from 07/18/2023 in [facility], [place]  AUDIT-C Score 0     Married STD testing and prevention (HIV/chl/gon/syphilis): completed Hep C: completed  Skin cancer: Discussed monitoring for atypical lesions Colorectal cancer: due Prostate cancer:  Lab Results  Component Value Date   PSA 0.38 02/18/2023     Lung cancer:   Low Dose CT Chest recommended if Age 9-80 years, 30 pack-year currently smoking OR have quit w/in 15years. Patient does not qualify.   AAA:  The USPSTF recommends one-time screening with ultrasonography in men ages 1 to 110 years who have ever smoked ECG:  08/11/2003  Vaccines:  HPV: up to at age 61 , ask insurance if age between 29-45  Shingrix: 63-64 yo and ask insurance if covered when patient above 49 yo Pneumonia: *** educated and discussed with patient. Flu: *** educated and  discussed with patient.  Advanced Care Planning: A voluntary discussion about advance care planning including the explanation and discussion of advance directives.  Discussed health care proxy and Living will, and the patient was able to identify a health care proxy as ***.  Patient {DOES_DOES WNU:81435} have a living will at present time. If patient does have living will, I have requested they bring this to the clinic to be scanned in to their chart.  Patient Active Problem List   Diagnosis Date Noted   Wheezing 08/22/2023   Spondylosis of lumbar spine 06/01/2023   Anxiety 06/01/2023   Mild episode of recurrent major depressive disorder (HCC) 06/01/2023   Rib pain on left side 02/18/2023   Medial epicondylitis of both elbows 03/17/2017   Lateral epicondylitis of both elbows 03/17/2017   Epicondylitis 11/06/2013   Chronic, continuous use of opioids 11/06/2013   Sacroiliac joint  pain 02/05/2013   Long term current use of opiate analgesic 02/05/2013   Encounter for other administrative examinations 02/05/2013   Chronic pain syndrome 11/15/2010   Tobacco use disorder 11/15/2010   Pain in joint, upper arm 11/15/2010   Neck pain 11/15/2010    Past Surgical History:  Procedure Laterality Date   CLAVICLE SURGERY Right    INNER EAR SURGERY     SHOULDER SURGERY Right    and clavical    Family History  Problem Relation Age of Onset   Emphysema Mother    Cancer Father     Social History   Socioeconomic History   Marital status: Married    Spouse name: Not on file   Number of children: 1   Years of education: Not on file   Highest education level: GED or equivalent  Occupational History   Not on file  Tobacco Use   Smoking status: Every Day    Current packs/day: 1.00    Types: Cigarettes   Smokeless tobacco: Never  Vaping Use   Vaping status: Not on file  Substance and Sexual Activity   Alcohol use: No   Drug use: No   Sexual activity: Not Currently    Birth control/protection: None  Other Topics Concern   Not on file  Social History Narrative   Not on file   Social Drivers of Health   Financial Resource Strain: Medium Risk (08/22/2023)   Overall Financial Resource Strain (CARDIA)    Difficulty of Paying Living Expenses: Somewhat hard  Food Insecurity: No Food Insecurity (08/22/2023)   Hunger Vital Sign    Worried About Running Out of Food in the Last Year: Never true    Ran Out of Food in the Last Year: Never true  Transportation Needs: No Transportation Needs (08/22/2023)   PRAPARE - Administrator, Civil Service (Medical): No    Lack of Transportation (Non-Medical): No  Physical Activity: Insufficiently Active (08/22/2023)   Exercise Vital Sign    Days of Exercise per Week: 3 days    Minutes of Exercise per Session: 20 min  Stress: Stress Concern Present (08/22/2023)   [employer] of Occupational Health -  Occupational Stress Questionnaire    Feeling of Stress : Very much  Social Connections: Socially Isolated (08/22/2023)   Social Connection and Isolation Panel    Frequency of Communication with Friends and Family: Never    Frequency of Social Gatherings with Friends and Family: Never    Attends Religious Services: Never    Database administrator or Organizations: No    Attends Club or  Organization Meetings: Not on file    Marital Status: Married  Catering manager Violence: Not on file     Current Outpatient Medications:    albuterol  (VENTOLIN  HFA) 108 (90 Base) MCG/ACT inhaler, Inhale 2 puffs into the lungs every 6 (six) hours as needed for wheezing or shortness of breath., Disp: 8 g, Rfl: 3   hydrOXYzine (ATARAX) 25 MG tablet, Take by mouth., Disp: , Rfl:    naloxone (NARCAN) nasal spray 4 mg/0.1 mL, One spray in either nostril once for known/suspected opioid overdose. May repeat every 2-3 minutes in alternating nostril til EMS arrives, Disp: , Rfl:    Oxycodone HCl 10 MG TABS, Take by mouth., Disp: , Rfl:    propranolol (INDERAL) 10 MG tablet, Take 10 mg by mouth once., Disp: , Rfl:    risperiDONE (RISPERDAL) 0.25 MG tablet, Take 0.25 mg by mouth daily., Disp: , Rfl:   No Known Allergies   ROS  Constitutional: Negative for fever or weight change.  Respiratory: Negative for cough and shortness of breath.   Cardiovascular: Negative for chest pain or palpitations.  Gastrointestinal: Negative for abdominal pain, no bowel changes.  Musculoskeletal: Negative for gait problem or joint swelling.  Skin: Negative for rash.  Neurological: Negative for dizziness or headache.  No other specific complaints in a complete review of systems (except as listed in HPI above).    Objective  There were no vitals filed for this visit.  There is no height or weight on file to calculate BMI.  Physical Exam Physical Exam     No results found for this or any previous visit (from the past 2160  hours).   Fall Risk:    07/18/2023   10:56 AM 06/01/2023   10:32 AM 02/18/2023    8:42 AM  Fall Risk   Falls in the past year? 0 0 0  Number falls in past yr: 0 0 0  Injury with Fall? 0 0 0  Risk for fall due to : No Fall Risks No Fall Risks No Fall Risks  Follow up Falls prevention discussed Falls prevention discussed Falls prevention discussed;Education provided;Falls evaluation completed   ***   Functional Status Survey:   ***   Assessment & Plan  Assessment and Plan Assessment & Plan      -Prostate cancer screening and PSA options (with potential risks and benefits of testing vs not testing) were discussed along with recent recs/guidelines. -USPSTF grade A and B recommendations reviewed with patient; age-appropriate recommendations, preventive care, screening tests, etc discussed and encouraged; healthy living encouraged; see AVS for patient education given to patient -Discussed importance of 150 minutes of physical activity weekly, eat two servings of fish weekly, eat one serving of tree nuts ( cashews, pistachios, pecans, almonds.SABRA) every other day, eat 6 servings of fruit/vegetables daily and drink plenty of water and avoid sweet beverages.  -Reviewed Health Maintenance: yes
